# Patient Record
Sex: Female | Born: 1960 | Race: White | Hispanic: No | Marital: Married | Smoking: Former smoker
Health system: Southern US, Community
[De-identification: ages and names within clinical notes are randomized; demographics above are authoritative.]

## PROBLEM LIST (undated history)

## (undated) DIAGNOSIS — K589 Irritable bowel syndrome without diarrhea: Secondary | ICD-10-CM

## (undated) DIAGNOSIS — R51 Headache: Secondary | ICD-10-CM

## (undated) DIAGNOSIS — S0300XA Dislocation of jaw, unspecified side, initial encounter: Secondary | ICD-10-CM

## (undated) DIAGNOSIS — M1990 Unspecified osteoarthritis, unspecified site: Secondary | ICD-10-CM

## (undated) DIAGNOSIS — D649 Anemia, unspecified: Secondary | ICD-10-CM

## (undated) DIAGNOSIS — Z87442 Personal history of urinary calculi: Secondary | ICD-10-CM

## (undated) DIAGNOSIS — F419 Anxiety disorder, unspecified: Secondary | ICD-10-CM

## (undated) DIAGNOSIS — E785 Hyperlipidemia, unspecified: Secondary | ICD-10-CM

## (undated) DIAGNOSIS — Z8669 Personal history of other diseases of the nervous system and sense organs: Secondary | ICD-10-CM

## (undated) HISTORY — DX: Hyperlipidemia, unspecified: E78.5

## (undated) HISTORY — DX: Irritable bowel syndrome, unspecified: K58.9

## (undated) HISTORY — DX: Personal history of other diseases of the nervous system and sense organs: Z86.69

## (undated) HISTORY — DX: Anxiety disorder, unspecified: F41.9

## (undated) HISTORY — DX: Anemia, unspecified: D64.9

## (undated) HISTORY — PX: LASIK: SHX215

---

## 1982-08-09 HISTORY — PX: NASAL SEPTUM SURGERY: SHX37

## 1991-08-10 DIAGNOSIS — Z87442 Personal history of urinary calculi: Secondary | ICD-10-CM

## 1991-08-10 HISTORY — DX: Personal history of urinary calculi: Z87.442

## 1999-05-22 ENCOUNTER — Encounter: Admission: RE | Admit: 1999-05-22 | Discharge: 1999-05-22 | Payer: Self-pay | Admitting: Obstetrics and Gynecology

## 1999-05-29 ENCOUNTER — Encounter: Payer: Self-pay | Admitting: Obstetrics and Gynecology

## 1999-05-29 ENCOUNTER — Encounter: Admission: RE | Admit: 1999-05-29 | Discharge: 1999-05-29 | Payer: Self-pay | Admitting: Obstetrics and Gynecology

## 1999-05-30 ENCOUNTER — Ambulatory Visit (HOSPITAL_COMMUNITY): Admission: RE | Admit: 1999-05-30 | Discharge: 1999-05-30 | Payer: Self-pay | Admitting: Obstetrics and Gynecology

## 2000-03-15 ENCOUNTER — Encounter: Admission: RE | Admit: 2000-03-15 | Discharge: 2000-03-15 | Payer: Self-pay | Admitting: Obstetrics and Gynecology

## 2000-03-15 ENCOUNTER — Encounter: Payer: Self-pay | Admitting: Obstetrics and Gynecology

## 2000-08-09 HISTORY — PX: VAGINAL HYSTERECTOMY: SUR661

## 2001-02-08 ENCOUNTER — Other Ambulatory Visit: Admission: RE | Admit: 2001-02-08 | Discharge: 2001-02-08 | Payer: Self-pay | Admitting: Obstetrics and Gynecology

## 2001-02-23 ENCOUNTER — Ambulatory Visit (HOSPITAL_COMMUNITY): Admission: RE | Admit: 2001-02-23 | Discharge: 2001-02-23 | Payer: Self-pay | Admitting: Obstetrics and Gynecology

## 2001-05-30 ENCOUNTER — Encounter: Payer: Self-pay | Admitting: Obstetrics and Gynecology

## 2001-05-30 ENCOUNTER — Encounter: Admission: RE | Admit: 2001-05-30 | Discharge: 2001-05-30 | Payer: Self-pay | Admitting: Obstetrics and Gynecology

## 2001-07-26 ENCOUNTER — Observation Stay (HOSPITAL_COMMUNITY): Admission: RE | Admit: 2001-07-26 | Discharge: 2001-07-27 | Payer: Self-pay | Admitting: Obstetrics and Gynecology

## 2002-05-31 ENCOUNTER — Encounter: Admission: RE | Admit: 2002-05-31 | Discharge: 2002-05-31 | Payer: Self-pay | Admitting: Obstetrics and Gynecology

## 2002-05-31 ENCOUNTER — Encounter: Payer: Self-pay | Admitting: Obstetrics and Gynecology

## 2003-06-17 ENCOUNTER — Encounter: Admission: RE | Admit: 2003-06-17 | Discharge: 2003-06-17 | Payer: Self-pay | Admitting: Obstetrics and Gynecology

## 2004-07-09 ENCOUNTER — Ambulatory Visit (HOSPITAL_COMMUNITY): Admission: RE | Admit: 2004-07-09 | Discharge: 2004-07-09 | Payer: Self-pay | Admitting: Obstetrics and Gynecology

## 2004-07-16 ENCOUNTER — Encounter: Admission: RE | Admit: 2004-07-16 | Discharge: 2004-07-16 | Payer: Self-pay | Admitting: Obstetrics and Gynecology

## 2005-09-23 ENCOUNTER — Ambulatory Visit (HOSPITAL_COMMUNITY): Admission: RE | Admit: 2005-09-23 | Discharge: 2005-09-23 | Payer: Self-pay | Admitting: Obstetrics and Gynecology

## 2012-10-24 ENCOUNTER — Telehealth: Payer: Self-pay | Admitting: General Practice

## 2012-10-24 NOTE — Telephone Encounter (Signed)
RETURNING CALL TO TRIAGE NURSE ABOUT AN  APPT

## 2012-10-24 NOTE — Telephone Encounter (Signed)
PT CALLED ABOUT APPT 

## 2012-11-09 ENCOUNTER — Other Ambulatory Visit: Payer: Self-pay | Admitting: Physician Assistant

## 2012-11-09 ENCOUNTER — Other Ambulatory Visit: Payer: Self-pay | Admitting: *Deleted

## 2012-11-09 DIAGNOSIS — F411 Generalized anxiety disorder: Secondary | ICD-10-CM

## 2012-11-09 MED ORDER — DIAZEPAM 5 MG PO TABS: 5.0000 mg | ORAL_TABLET | Freq: Two times a day (BID) | ORAL | Status: DC | PRN

## 2012-11-09 MED ORDER — DIAZEPAM 5 MG PO TABS: 5.0000 mg | ORAL_TABLET | Freq: Three times a day (TID) | ORAL | Status: DC | PRN

## 2012-11-09 NOTE — Telephone Encounter (Signed)
Please forward request to [facility] as she will be seeing her in the future

## 2012-11-09 NOTE — Telephone Encounter (Signed)
Last seen by [facility] on12/04/13

## 2012-11-09 NOTE — Telephone Encounter (Signed)
Authorized and printed at station A

## 2012-11-10 ENCOUNTER — Other Ambulatory Visit: Payer: Self-pay | Admitting: *Deleted

## 2012-11-10 NOTE — Telephone Encounter (Signed)
Left pt message, rx for Valium is ready for pt to come by office for.

## 2012-11-10 NOTE — Telephone Encounter (Signed)
Left message to pickup prescription at front desk.  

## 2012-11-16 ENCOUNTER — Encounter: Payer: Self-pay | Admitting: Family Medicine

## 2012-11-16 ENCOUNTER — Ambulatory Visit (INDEPENDENT_AMBULATORY_CARE_PROVIDER_SITE_OTHER): Payer: 59

## 2012-11-16 ENCOUNTER — Ambulatory Visit (INDEPENDENT_AMBULATORY_CARE_PROVIDER_SITE_OTHER): Payer: 59 | Admitting: Family Medicine

## 2012-11-16 VITALS — BP 111/73 | HR 83 | Temp 96.7°F | Ht 66.75 in | Wt 137.6 lb

## 2012-11-16 DIAGNOSIS — Z130 Encounter for screening for diseases of the blood and blood-forming organs and certain disorders involving the immune mechanism: Secondary | ICD-10-CM

## 2012-11-16 DIAGNOSIS — Z0000 Encounter for general adult medical examination without abnormal findings: Secondary | ICD-10-CM

## 2012-11-16 DIAGNOSIS — H6120 Impacted cerumen, unspecified ear: Secondary | ICD-10-CM

## 2012-11-16 DIAGNOSIS — Z139 Encounter for screening, unspecified: Secondary | ICD-10-CM

## 2012-11-16 DIAGNOSIS — M25512 Pain in left shoulder: Secondary | ICD-10-CM

## 2012-11-16 DIAGNOSIS — H6123 Impacted cerumen, bilateral: Secondary | ICD-10-CM

## 2012-11-16 DIAGNOSIS — M545 Low back pain, unspecified: Secondary | ICD-10-CM

## 2012-11-16 DIAGNOSIS — M25519 Pain in unspecified shoulder: Secondary | ICD-10-CM

## 2012-11-16 DIAGNOSIS — Z13220 Encounter for screening for lipoid disorders: Secondary | ICD-10-CM

## 2012-11-16 DIAGNOSIS — Z1321 Encounter for screening for nutritional disorder: Secondary | ICD-10-CM

## 2012-11-16 DIAGNOSIS — R42 Dizziness and giddiness: Secondary | ICD-10-CM

## 2012-11-16 LAB — POCT CBC
HCT, POC: 42.3 % (ref 37.7–47.9)
Lymph, poc: 3.8 — AB (ref 0.6–3.4)
MCH, POC: 31.1 pg (ref 27–31.2)
MCHC: 32.7 g/dL (ref 31.8–35.4)
MPV: 8.1 fL (ref 0–99.8)
Platelet Count, POC: 367 10*3/uL (ref 142–424)
RBC: 4.4 M/uL (ref 4.04–5.48)
RDW, POC: 15.5 %
WBC: 10.7 10*3/uL — AB (ref 4.6–10.2)

## 2012-11-16 LAB — BASIC METABOLIC PANEL WITH GFR
CO2: 25 mEq/L (ref 19–32)
Sodium: 140 mEq/L (ref 135–145)

## 2012-11-16 LAB — HEPATIC FUNCTION PANEL
Indirect Bilirubin: 0.1 mg/dL (ref 0.0–0.9)
Total Bilirubin: 0.2 mg/dL — ABNORMAL LOW (ref 0.3–1.2)

## 2012-11-16 LAB — THYROID PANEL WITH TSH
T3 Uptake: 31.3 % (ref 22.5–37.0)
T4, Total: 6.7 ug/dL (ref 5.0–12.5)

## 2012-11-16 NOTE — Progress Notes (Signed)
  Subjective:    Patient ID: Theresa Pham, female    DOB: March 08, 1961, 52 y.o.   MRN: [id number]  HPI Theresa Pham comes in today for a physical exam She also complains that allergies are bothering her a lot at this point in time. She is taking Sudafed 30 mg and Benadryl 25 mg.   Review of Systems  Constitutional: Negative.   HENT: Negative.   Eyes: Positive for itching (due to allergies).  Respiratory: Negative.   Cardiovascular: Negative.   Gastrointestinal: Negative.   Genitourinary: Negative.   Musculoskeletal: Negative.   Allergic/Immunologic: Positive for environmental allergies.  Neurological: Positive for dizziness (inner ear).  Psychiatric/Behavioral: Positive for sleep disturbance (X 1 week). The patient is nervous/anxious (takes meds).        Objective:   Physical Exam  BP 111/73  Pulse 83  Temp(Src) 96.7 F (35.9 C) (Oral)  Ht 5' 6.75" (1.695 m)  Wt 137 lb 9.6 oz (62.415 kg)  BMI 21.72 kg/m2  The patient appeared well nourished and normally developed, alert and oriented to time and place. Speech, behavior and judgement appear normal. Vital signs as documented.  Head exam is unremarkable. No scleral icterus or pallor noted. There is earwax bilaterally. There is drainage down the back of the throat. Neck is without jugular venous distension, thyromegally, or carotid bruits. Carotid upstrokes are brisk bilaterally. No cervical adenopathy. Lungs are clear anteriorly and posteriorly to auscultation. Normal respiratory effort. Cardiac exam reveals regular rate and rhythm@ 72/min. First and second heart sounds normal. No murmurs, rubs or gallops.  Abdominal exam reveals normal bowl sounds, no masses, no organomegaly and no aortic enlargement. No inguinal adenopathy. Light epigastric tenderness. Extremities are nonedematous and both femoral and pedal pulses are normal. Skin without pallor or jaundice.  Warm and dry, without rash. Neurologic exam reveals normal but diminished  deep tendon reflexes and normal sensation.          Assessment & Plan:  1. Dizziness and giddiness - POCT CBC - Thyroid Panel With TSH - BASIC METABOLIC PANEL WITH GFR - EKG [id number]; Standing - EKG 12-Lead  2. Encounter for vitamin deficiency screening - Vitamin D 25 hydroxy  3. Screening cholesterol level - NMR Lipoprofile with Lipids - Hepatic function panel  4. Pain in joint, shoulder region, left - Ambulatory referral to Chiropractic  5. LBP (low back pain) - Ambulatory referral to Chiropractic  6. Screening - MM Digital Screening; Future  7. Routine general medical examination at a health care facility - DG Chest 2 View; Future - EKG 12-Lead; Standing - EKG 12-Lead  8. Excessive cerumen in ear canal, bilateral - Ear wax removal

## 2012-11-16 NOTE — Patient Instructions (Addendum)
Schedule mammogram, pap and pelvic exam Continue current meds and therapeutic lifestyle changes Try Nasacort AQ over-the-counter. 2 sprays each nostril once a day for 2 weeks, then 1 spray each nostril  Mucinex maximum strength 1 twice daily as needed for cough and congestion Routine labs were drawn today and  will be called about the results of these

## 2012-11-17 LAB — VITAMIN D 25 HYDROXY (VIT D DEFICIENCY, FRACTURES): Vit D, 25-Hydroxy: 35 ng/mL (ref 30–89)

## 2012-11-17 LAB — NMR LIPOPROFILE WITH LIPIDS
Cholesterol, Total: 222 mg/dL — ABNORMAL HIGH (ref <200)
HDL Size: 8.8 nm — ABNORMAL LOW (ref >=9.2)
LDL Size: 21 nm (ref >20.5)
LP-IR Score: 35 (ref <=45)
Small LDL Particle Number: 624 nmol/L — ABNORMAL HIGH (ref <=527)

## 2012-11-20 ENCOUNTER — Telehealth: Payer: Self-pay | Admitting: Family Medicine

## 2012-11-20 NOTE — Progress Notes (Signed)
Pt notified of result

## 2012-11-20 NOTE — Telephone Encounter (Signed)
Pt notified of results

## 2012-11-22 ENCOUNTER — Ambulatory Visit (HOSPITAL_COMMUNITY): Payer: Self-pay

## 2012-12-07 ENCOUNTER — Encounter: Payer: Self-pay | Admitting: Pharmacist

## 2012-12-07 ENCOUNTER — Ambulatory Visit (INDEPENDENT_AMBULATORY_CARE_PROVIDER_SITE_OTHER): Payer: 59 | Admitting: Pharmacist

## 2012-12-07 VITALS — BP 124/80 | HR 76 | Ht 67.5 in | Wt 140.0 lb

## 2012-12-07 DIAGNOSIS — G43909 Migraine, unspecified, not intractable, without status migrainosus: Secondary | ICD-10-CM

## 2012-12-07 DIAGNOSIS — E785 Hyperlipidemia, unspecified: Secondary | ICD-10-CM

## 2012-12-07 DIAGNOSIS — F411 Generalized anxiety disorder: Secondary | ICD-10-CM

## 2012-12-07 NOTE — Progress Notes (Signed)
Lipid Clinic Consultation  Chief Complaint:   Chief Complaint  Patient presents with  . Hyperlipidemia    Filed Vitals:   12/07/12 1612  BP: 124/80  Pulse: 76    Exam Edema:  negative Respirations:  normal   Carotid Bruits:  negative Xanthomas:  negative General Appearance:  well nourished Mood/Affect:  normal  HPI:  Patient has fasting labs 11/16/2012 that showed elevated Tg and LDL-P.  FH - positive for CAD - father had MI at 53yo         Negative for DM          Positive cancer - mother had uterine CA at 64yo, maternal aunt breast CA        Component Value Date/Time   TRIG 154* 11/16/2012 1019   LDL-P = 1899 LDL-C = 156  Assessment: CHD/CHF Risk Equivalents:  none AHA ASCVD risk assessment:    30yr risk = 2.8%  NCEP Risk Factors Present:  family history Primary Problem(s):  LDL or LDL-P elevated and TG elevated  Lipid Goals: LDL Goal < 130 HDL Goal >/= 45 Tg Goal < 161 Non-HDL Goal < 160  Secondary cause of hyperlipidemia present:  none Low fat diet followed?  No - eats little Debbie oatmeal cookie for breakfast.  Lunch- usually skips or has grilled chicken or nabs.  Supper - meat and 2 vegetables Low carb diet followed?  No -   Exercise?  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  Increase whole grains - oatmeal, whole grain rice, quinoa  Limit meal serving sizes to palm size and avoid processed meats like sausage, bologna and hot dogs Increase aerobic exercise - 30 to 40 minutes daily 4 days per week  Recheck Lipid Panel:  3 months   Time spent counseling patient:  30 minutes   Referring Provider:  Rudi Heap

## 2012-12-20 ENCOUNTER — Other Ambulatory Visit: Payer: Self-pay

## 2012-12-20 MED ORDER — DIAZEPAM 5 MG PO TABS: 5.0000 mg | ORAL_TABLET | Freq: Three times a day (TID) | ORAL | Status: DC | PRN

## 2012-12-20 NOTE — Telephone Encounter (Signed)
Please phone in rx for diazepam

## 2012-12-20 NOTE — Telephone Encounter (Signed)
Last filled 11/13/12  Last seen 12/07/12   Phone in RX and have nurse call patient

## 2012-12-21 NOTE — Telephone Encounter (Signed)
Pt and pharm aware of rx diazepam

## 2013-01-23 ENCOUNTER — Other Ambulatory Visit: Payer: Self-pay | Admitting: Nurse Practitioner

## 2013-01-23 ENCOUNTER — Other Ambulatory Visit: Payer: 59 | Admitting: Nurse Practitioner

## 2013-01-23 MED ORDER — DIAZEPAM 5 MG PO TABS: 5.0000 mg | ORAL_TABLET | Freq: Three times a day (TID) | ORAL | Status: DC | PRN

## 2013-01-23 NOTE — Telephone Encounter (Signed)
Rx called to [facility]

## 2013-01-23 NOTE — Telephone Encounter (Signed)
Please call in rx for valium with 1 refill

## 2013-01-24 NOTE — Telephone Encounter (Signed)
Med called to pharm 

## 2013-04-04 ENCOUNTER — Other Ambulatory Visit: Payer: Self-pay

## 2013-04-04 MED ORDER — DIAZEPAM 5 MG PO TABS: 5.0000 mg | ORAL_TABLET | Freq: Three times a day (TID) | ORAL | Status: DC | PRN

## 2013-04-04 NOTE — Telephone Encounter (Signed)
I did not prescribe this medication to this patient originally. She should try to reduce the frequency of taking this to twice daily. We may refill it and give her #60 one twice daily as needed with one refill. She should work on reducing this further to 1 daily on future refills.

## 2013-04-04 NOTE — Telephone Encounter (Signed)
Last seen 11/16/12 DWM  Last filled 01/23/13  If approved route to nurse to call in

## 2013-04-06 ENCOUNTER — Ambulatory Visit: Payer: 59 | Admitting: Family Medicine

## 2013-04-12 ENCOUNTER — Encounter: Payer: Self-pay | Admitting: General Practice

## 2013-04-12 ENCOUNTER — Ambulatory Visit (INDEPENDENT_AMBULATORY_CARE_PROVIDER_SITE_OTHER): Payer: 59 | Admitting: General Practice

## 2013-04-12 VITALS — BP 115/78 | HR 82 | Temp 97.1°F | Ht 67.0 in | Wt 132.0 lb

## 2013-04-12 DIAGNOSIS — R112 Nausea with vomiting, unspecified: Secondary | ICD-10-CM

## 2013-04-12 DIAGNOSIS — E785 Hyperlipidemia, unspecified: Secondary | ICD-10-CM

## 2013-04-12 LAB — POCT CBC
Granulocyte percent: 57.2 %G (ref 37–80)
Lymph, poc: 3.6 — AB (ref 0.6–3.4)
MCHC: 32.8 g/dL (ref 31.8–35.4)
MPV: 7.6 fL (ref 0–99.8)
POC Granulocyte: 5.5 (ref 2–6.9)
POC LYMPH PERCENT: 37.3 %L (ref 10–50)
Platelet Count, POC: 323 10*3/uL (ref 142–424)
RBC: 4.2 M/uL (ref 4.04–5.48)
RDW, POC: 15 %
WBC: 9.6 10*3/uL (ref 4.6–10.2)

## 2013-04-12 NOTE — Progress Notes (Signed)
  Subjective:    Patient ID: Theresa Pham, female    DOB: 08/18/1960, 52 y.o.   MRN: [id number]  Emesis  This is a new problem. The current episode started more than 1 month ago (6-8 months ago). The problem occurs intermittently. The problem has been gradually worsening. The emesis has an appearance of bile. There has been no fever. Associated symptoms include myalgias. Pertinent negatives include no abdominal pain, chest pain, chills, coughing, diarrhea, dizziness, fever or headaches. She has tried nothing for the symptoms.  Reports having bowel movements three to four times a week.     Review of Systems  Constitutional: Negative for fever and chills.  HENT: Negative for neck pain and neck stiffness.   Respiratory: Negative for cough and chest tightness.   Cardiovascular: Negative for chest pain.  Gastrointestinal: Positive for vomiting. Negative for nausea, abdominal pain, diarrhea, blood in stool and abdominal distention.  Genitourinary: Negative for hematuria and difficulty urinating.  Musculoskeletal: Positive for myalgias.  Neurological: Negative for dizziness, weakness and headaches.       Objective:   Physical Exam  Constitutional: She is oriented to person, place, and time. She appears well-developed and well-nourished.  HENT:  Head: Normocephalic and atraumatic.  Right Ear: External ear normal.  Left Ear: External ear normal.  Nose: Nose normal.  Mouth/Throat: Oropharynx is clear and moist.  Eyes: EOM are normal. Pupils are equal, round, and reactive to light.  Neck: Normal range of motion. Neck supple. No thyromegaly present.  Cardiovascular: Normal rate, regular rhythm and normal heart sounds.   Pulmonary/Chest: Effort normal and breath sounds normal. No respiratory distress. She exhibits no tenderness.  Abdominal: Soft. Bowel sounds are normal. She exhibits no distension. There is no tenderness.  Lymphadenopathy:    She has no cervical adenopathy.  Neurological:  She is alert and oriented to person, place, and time.  Skin: Skin is warm and dry.  Psychiatric: She has a normal mood and affect.          Assessment & Plan:  1. Other and unspecified hyperlipidemia - NMR, lipoprofile  2. Nausea with vomiting - POCT CBC - CMP14+EGFR - Ambulatory referral to Gastroenterology -RTO or seek emergency medical treatment if symptoms worsen or unresolved -Patient verbalized understanding -Coralie Keens, FNP-C

## 2013-04-12 NOTE — Patient Instructions (Signed)

## 2013-04-14 LAB — CMP14+EGFR
ALT: 9 IU/L (ref 0–32)
Albumin/Globulin Ratio: 1.9 (ref 1.1–2.5)
Albumin: 4.6 g/dL (ref 3.5–5.5)
Calcium: 10.1 mg/dL (ref 8.7–10.2)
GFR calc Af Amer: 115 mL/min/{1.73_m2} (ref >59)
Globulin, Total: 2.4 g/dL (ref 1.5–4.5)
Potassium: 4.1 mmol/L (ref 3.5–5.2)
Total Protein: 7 g/dL (ref 6.0–8.5)

## 2013-04-14 LAB — NMR, LIPOPROFILE
Cholesterol: 226 mg/dL — ABNORMAL HIGH (ref <200)
HDL Particle Number: 25.8 umol/L — ABNORMAL LOW (ref >=30.5)
LDL Size: 21.1 nm (ref >20.5)
LDLC SERPL CALC-MCNC: 158 mg/dL — ABNORMAL HIGH (ref <100)
Small LDL Particle Number: 613 nmol/L — ABNORMAL HIGH (ref <=527)

## 2013-04-18 ENCOUNTER — Telehealth: Payer: Self-pay | Admitting: General Practice

## 2013-04-30 ENCOUNTER — Encounter: Payer: Self-pay | Admitting: Internal Medicine

## 2013-05-02 ENCOUNTER — Telehealth: Payer: Self-pay | Admitting: *Deleted

## 2013-05-02 NOTE — Telephone Encounter (Signed)
Pt uses [facility] in [place].  Please send in refills on Effexor and Imitrex if you havn't already.

## 2013-05-04 ENCOUNTER — Other Ambulatory Visit: Payer: Self-pay | Admitting: General Practice

## 2013-05-04 DIAGNOSIS — F411 Generalized anxiety disorder: Secondary | ICD-10-CM

## 2013-05-04 DIAGNOSIS — G43909 Migraine, unspecified, not intractable, without status migrainosus: Secondary | ICD-10-CM

## 2013-05-04 MED ORDER — VENLAFAXINE HCL ER 75 MG PO CP24: 75.0000 mg | ORAL_CAPSULE | Freq: Every day | ORAL | Status: DC

## 2013-05-04 MED ORDER — SUMATRIPTAN SUCCINATE 100 MG PO TABS: ORAL_TABLET | ORAL | Status: DC

## 2013-05-04 NOTE — Telephone Encounter (Signed)
Script sent to pharmacy.

## 2013-05-14 ENCOUNTER — Other Ambulatory Visit: Payer: Self-pay

## 2013-05-14 NOTE — Telephone Encounter (Signed)
Last seen 04/12/13  Mae   If approved route and phone into [facility] in [place]

## 2013-05-16 ENCOUNTER — Other Ambulatory Visit: Payer: Self-pay | Admitting: Family Medicine

## 2013-05-16 NOTE — Telephone Encounter (Signed)
If approved route to nurse pool A and then approve.

## 2013-05-17 ENCOUNTER — Telehealth: Payer: Self-pay | Admitting: General Practice

## 2013-05-17 ENCOUNTER — Other Ambulatory Visit: Payer: Self-pay | Admitting: Family Medicine

## 2013-05-18 MED ORDER — DIAZEPAM 5 MG PO TABS: 5.0000 mg | ORAL_TABLET | Freq: Three times a day (TID) | ORAL | Status: DC | PRN

## 2013-05-18 NOTE — Telephone Encounter (Signed)
Spoke with Judie Grieve at [facility] in [place] and authorized one month. Patient will need to be seen for additional refills.

## 2013-05-18 NOTE — Telephone Encounter (Signed)
DEINED, PT AWARE, WILL MAKE APPT

## 2013-05-22 ENCOUNTER — Encounter: Payer: Self-pay | Admitting: General Practice

## 2013-05-22 ENCOUNTER — Encounter (INDEPENDENT_AMBULATORY_CARE_PROVIDER_SITE_OTHER): Payer: 59 | Admitting: General Practice

## 2013-05-22 DIAGNOSIS — F411 Generalized anxiety disorder: Secondary | ICD-10-CM

## 2013-05-22 NOTE — Progress Notes (Signed)
Patient ID: Theresa Pham, female   DOB: 08/20/1960, 52 y.o.   MRN: [id number]  Patient only her to pick up prescription.

## 2013-05-28 ENCOUNTER — Encounter: Payer: Self-pay | Admitting: Internal Medicine

## 2013-05-29 ENCOUNTER — Ambulatory Visit (INDEPENDENT_AMBULATORY_CARE_PROVIDER_SITE_OTHER): Payer: 59 | Admitting: Internal Medicine

## 2013-05-29 ENCOUNTER — Encounter: Payer: Self-pay | Admitting: Internal Medicine

## 2013-05-29 ENCOUNTER — Other Ambulatory Visit (INDEPENDENT_AMBULATORY_CARE_PROVIDER_SITE_OTHER): Payer: 59

## 2013-05-29 VITALS — BP 120/78 | HR 66 | Ht 67.0 in | Wt 134.4 lb

## 2013-05-29 DIAGNOSIS — Z1211 Encounter for screening for malignant neoplasm of colon: Secondary | ICD-10-CM

## 2013-05-29 DIAGNOSIS — R109 Unspecified abdominal pain: Secondary | ICD-10-CM

## 2013-05-29 DIAGNOSIS — R112 Nausea with vomiting, unspecified: Secondary | ICD-10-CM

## 2013-05-29 MED ORDER — MOVIPREP 100 G PO SOLR: ORAL | Status: DC

## 2013-05-29 MED ORDER — ONDANSETRON 4 MG PO TBDP: 4.0000 mg | ORAL_TABLET | Freq: Three times a day (TID) | ORAL | Status: DC | PRN

## 2013-05-29 NOTE — Patient Instructions (Signed)
You have been scheduled for a colonoscopyEndoscopy with propofol. Please follow written instructions given to you at your visit today.  Please pick up your prep kit at the pharmacy within the next 1-3 days. If you use inhalers (even only as needed), please bring them with you on the day of your procedure. Your physician has requested that you go to www.startemmi.com and enter the access code given to you at your visit today. This web site gives a general overview about your procedure. However, you should still follow specific instructions given to you by our office regarding your preparation for the procedure.  You have been scheduled for an abdominal ultrasound at [facility] (1st floor of hospital) on 05/30/2013 at 9:00. Please arrive 15 minutes prior to your appointment for registration. Make certain not to have anything to eat or drink 6 hours prior to your appointment. Should you need to reschedule your appointment, please contact radiology at [phone]. This test typically takes about 30 minutes to perform.  Your physician has requested that you go to the basement for the following lab work before leaving today: Celiac Panel  We have sent the following medications to your pharmacy for you to pick up at your convenience: Moviprep, Zofran                                               We are excited to introduce MyChart, a new best-in-class service that provides you online access to important information in your electronic medical record. We want to make it easier for you to view your health information - all in one secure location - when and where you need it. We expect MyChart will enhance the quality of care and service we provide.  When you register for MyChart, you can:    View your test results.    Request appointments and receive appointment reminders via email.    Request medication renewals.    View your medical history, allergies, medications and immunizations.    Communicate with your physician's office through a password-protected site.    Conveniently print information such as your medication lists.  To find out if MyChart is right for you, please talk to a member of our clinical staff today. We will gladly answer your questions about this free health and wellness tool.  If you are age 53 or older and want a member of your family to have access to your record, you must provide written consent by completing a proxy form available at our office. Please speak to our clinical staff about guidelines regarding accounts for patients younger than age 20.  As you activate your MyChart account and need any technical assistance, please call the MyChart technical support line at [phone]) or email your question to [email or web address]. If you email your question(s), please include your name, a return phone number and the best time to reach you.  If you have non-urgent health-related questions, you can send a message to our office through MyChart at [email or web address]. If you have a medical emergency, call 911.  Thank you for using MyChart as your new health and wellness resource!   MyChart licensed from [employer],  [id number]. Patents Pending.

## 2013-05-29 NOTE — Progress Notes (Signed)
Patient ID: Theresa Pham, female   DOB: Dec 20, 1960, 52 y.o.   MRN: [id number] HPI: Theresa Pham is a 52 year old female with a past medical history of anxiety, depression, hyperlipidemia, migraines, and kidney stones who is seen in consultation at the request of Dr. Christell Constant to evaluate episodic abdominal pain with nausea and vomiting. She is here alone today. The patient reports over the last approximate year she has developed episodic abdominal pain associated with nausea and vomiting. She reports this happens approximately every 4-8 weeks over the last several months. Her last episode was in late September. She reports waking, usually early around 5 AM, with nausea and vomiting and "cold sweats". This is often associated with epigastric abdominal pain and extreme fatigue. Her symptoms last most of the day and are usually significantly improved by the next day. She does not have associated diarrhea or change in bowel habit. She reports normal bowel movements once daily to once every other day. No diarrhea or constipation. She denies blood in her stool or melena. Her weight has been stable. He does use Goody's powder at least daily for aches and pains and headaches. Occasionally she will use this 2-3 times daily. She does use Phenergan as needed for nausea which seems to help and she is able to keep it down. No other new medicines. She denies excessive alcohol intake. No known family history of gallstones, celiac disease or IBD. She's never had endoscopy or colonoscopy.  Past Medical History  Diagnosis Date  . Depression   . Anxiety   . Hx of migraine headaches   . Hyperlipidemia   . IBS (irritable bowel syndrome)   . Kidney stone 1993    x 1    Past Surgical History  Procedure Laterality Date  . Vaginal hysterectomy      has her right ovary  . Lasik  2006, 2012    Current Outpatient Prescriptions  Medication Sig Dispense Refill  . Aspirin-Acetaminophen-Caffeine (GOODY HEADACHE PO) Take by  mouth as needed.      . cholecalciferol (VITAMIN D) 1000 UNITS tablet Take 1,000 Units by mouth daily.      . diazepam (VALIUM) 5 MG tablet Take 1 tablet (5 mg total) by mouth every 8 (eight) hours as needed for anxiety.  90 tablet  0  . diphenhydrAMINE (BENADRYL) 25 MG tablet Take 25 mg by mouth every 6 (six) hours as needed for allergies or sleep.      . Ginger, Zingiber officinalis, (GINGER ROOT PO) Take 1 capsule by mouth. As needed for stomach upset      . KRILL OIL PO Take by mouth.      . meclizine (ANTIVERT) 25 MG tablet Take 25 mg by mouth 3 (three) times daily as needed for dizziness.      . milk thistle 175 MG tablet Take 175 mg by mouth daily.      . pseudoephedrine (SUDAFED) 30 MG tablet Take 60 mg by mouth every 4 (four) hours as needed for congestion.      . SUMAtriptan (IMITREX) 100 MG tablet May take 100mg  once, then repeat after two hours if needed. Not more than 200mg  in 24 hours.  10 tablet  0  . venlafaxine XR (EFFEXOR-XR) 75 MG 24 hr capsule Take 1 capsule (75 mg total) by mouth daily.  30 capsule  3  . MOVIPREP 100 G SOLR Use per prep instruction  1 kit  0  . ondansetron (ZOFRAN ODT) 4 MG disintegrating tablet Take 1 tablet (  4 mg total) by mouth every 8 (eight) hours as needed for nausea.  20 tablet  0   No current facility-administered medications for this visit.    Allergies  Allergen Reactions  . Azithromycin Diarrhea  . Penicillins Rash  . Codeine Nausea Only    Family History  Problem Relation Age of Onset  . Hypertension Mother   . Uterine cancer Mother   . Heart attack Father 74  . Colon cancer Neg Hx   . Breast cancer Maternal Aunt     History  Substance Use Topics  . Smoking status: Former Smoker    Types: Cigarettes  . Smokeless tobacco: Never Used  . Alcohol Use: Yes     Comment: rare, social    ROS: As per history of present illness, otherwise negative  BP 120/78  Pulse 66  Ht 5\' 7"  (1.702 m)  Wt 134 lb 6.4 oz (60.963 kg)  BMI 21.04  kg/m2 Constitutional: Well-developed and well-nourished. No distress. HEENT: Normocephalic and atraumatic. Oropharynx is clear and moist. No oropharyngeal exudate. Conjunctivae are normal.  No scleral icterus. Neck: Neck supple. Trachea midline. Cardiovascular: Normal rate, regular rhythm and intact distal pulses. Pulmonary/chest: Effort normal and breath sounds normal. No wheezing, rales or rhonchi. Abdominal: Soft, nontender, nondistended. Bowel sounds active throughout.  Extremities: no clubbing, cyanosis, or edema Lymphadenopathy: No cervical adenopathy noted. Neurological: Alert and oriented to person place and time. Skin: Skin is warm and dry. No rashes noted. Psychiatric: Normal mood and affect. Behavior is normal.  RELEVANT LABS AND IMAGING: CBC    Component Value Date/Time   WBC 9.6 04/12/2013 1052   RBC 4.2 04/12/2013 1052   HGB 13.0 04/12/2013 1052   HCT 39.6 04/12/2013 1052   MCV 95.2 04/12/2013 1052   MCH 31.2 04/12/2013 1052   MCHC 32.8 04/12/2013 1052    CMP     Component Value Date/Time   NA 143 04/12/2013 1023   NA 140 11/16/2012 1019   K 4.1 04/12/2013 1023   CL 101 04/12/2013 1023   CO2 25 04/12/2013 1023   GLUCOSE 82 04/12/2013 1023   GLUCOSE 60* 11/16/2012 1019   BUN 7 04/12/2013 1023   BUN 11 11/16/2012 1019   CREATININE 0.68 04/12/2013 1023   CREATININE 0.84 11/16/2012 1019   CALCIUM 10.1 04/12/2013 1023   PROT 7.0 04/12/2013 1023   PROT 7.2 11/16/2012 1019   ALBUMIN 4.6 11/16/2012 1019   AST 17 04/12/2013 1023   ALT 9 04/12/2013 1023   ALKPHOS 90 04/12/2013 1023   BILITOT 0.3 04/12/2013 1023   GFRNONAA 100 04/12/2013 1023   GFRAA 115 04/12/2013 1023    ASSESSMENT/PLAN: 52 year old female with a past medical history of anxiety, depression, hyperlipidemia, migraines, and kidney stones who is seen in consultation at the request of Dr. Christell Constant to evaluate episodic abdominal pain with nausea and vomiting.   1.  Episodic epigastric pain with N/V -- her symptoms raise the question of peptic ulcer  disease/gastroduodenitis or possibly gallstones with attacks of biliary colic. She also is at risk for gastritis and ulcer disease due to daily use of Goody's powders. We discussed workup and I have recommended an upper endoscopy to evaluate for gastritis and ulcer disease. We will also perform an abdominal ultrasound to rule out gallstones. I will prescribe Zofran ODT which she can use as needed and as directed for nausea. Given that this will dissolve in her mouth, it may be easier for her to take then oral Phenergan when she  is nauseous. I also will check a celiac panel.  2.  Colon cancer screening -- I recommended a screening colonoscopy based on her age. She has never had screening colonoscopy. We discussed the test today including the risks and benefits of both the upper endoscopy and colonoscopy, and she is agreeable to proceed

## 2013-05-30 ENCOUNTER — Ambulatory Visit (HOSPITAL_COMMUNITY)
Admission: RE | Admit: 2013-05-30 | Discharge: 2013-05-30 | Disposition: A | Discharge status: Home / Self Care | Payer: 59 | Source: Ambulatory Visit | Attending: Internal Medicine | Admitting: Internal Medicine

## 2013-05-30 DIAGNOSIS — K8020 Calculus of gallbladder without cholecystitis without obstruction: Secondary | ICD-10-CM | POA: Insufficient documentation

## 2013-05-30 DIAGNOSIS — R112 Nausea with vomiting, unspecified: Secondary | ICD-10-CM

## 2013-05-30 DIAGNOSIS — R109 Unspecified abdominal pain: Secondary | ICD-10-CM

## 2013-05-30 LAB — TISSUE TRANSGLUTAMINASE, IGA: Tissue Transglutaminase Ab, IgA: 15.8 U/mL (ref <20)

## 2013-06-04 ENCOUNTER — Ambulatory Visit (AMBULATORY_SURGERY_CENTER): Payer: 59 | Admitting: Internal Medicine

## 2013-06-04 ENCOUNTER — Encounter: Payer: Self-pay | Admitting: Internal Medicine

## 2013-06-04 VITALS — BP 100/73 | HR 66 | Temp 97.3°F | Resp 19 | Ht 67.0 in | Wt 134.0 lb

## 2013-06-04 DIAGNOSIS — K2970 Gastritis, unspecified, without bleeding: Secondary | ICD-10-CM

## 2013-06-04 DIAGNOSIS — D126 Benign neoplasm of colon, unspecified: Secondary | ICD-10-CM

## 2013-06-04 DIAGNOSIS — R1013 Epigastric pain: Secondary | ICD-10-CM

## 2013-06-04 DIAGNOSIS — R112 Nausea with vomiting, unspecified: Secondary | ICD-10-CM

## 2013-06-04 DIAGNOSIS — Z1211 Encounter for screening for malignant neoplasm of colon: Secondary | ICD-10-CM

## 2013-06-04 DIAGNOSIS — K2990 Gastroduodenitis, unspecified, without bleeding: Secondary | ICD-10-CM

## 2013-06-04 MED ORDER — SODIUM CHLORIDE 0.9 % IV SOLN: 500.0000 mL | INTRAVENOUS | Status: DC

## 2013-06-04 NOTE — Patient Instructions (Signed)
Discharge instructions given with verbal understanding. Handout on gastritis. Resume previous medications. YOU HAD AN ENDOSCOPIC PROCEDURE TODAY AT THE [facility]: Refer to the procedure report that was given to you for any specific questions about what was found during the examination.  If the procedure report does not answer your questions, please call your gastroenterologist to clarify.  If you requested that your care partner not be given the details of your procedure findings, then the procedure report has been included in a sealed envelope for you to review at your convenience later.  YOU SHOULD EXPECT: Some feelings of bloating in the abdomen. Passage of more gas than usual.  Walking can help get rid of the air that was put into your GI tract during the procedure and reduce the bloating. If you had a lower endoscopy (such as a colonoscopy or flexible sigmoidoscopy) you may notice spotting of blood in your stool or on the toilet paper. If you underwent a bowel prep for your procedure, then you may not have a normal bowel movement for a few days.  DIET: Your first meal following the procedure should be a light meal and then it is ok to progress to your normal diet.  A half-sandwich or bowl of soup is an example of a good first meal.  Heavy or fried foods are harder to digest and may make you feel nauseous or bloated.  Likewise meals heavy in dairy and vegetables can cause extra gas to form and this can also increase the bloating.  Drink plenty of fluids but you should avoid alcoholic beverages for 24 hours.  ACTIVITY: Your care partner should take you home directly after the procedure.  You should plan to take it easy, moving slowly for the rest of the day.  You can resume normal activity the day after the procedure however you should NOT DRIVE or use heavy machinery for 24 hours (because of the sedation medicines used during the test).    SYMPTOMS TO REPORT IMMEDIATELY: A  gastroenterologist can be reached at any hour.  During normal business hours, 8:30 AM to 5:00 PM Monday through Friday, call [phone].  After hours and on weekends, please call the GI answering service at [phone] who will take a message and have the physician on call contact you.   Following lower endoscopy (colonoscopy or flexible sigmoidoscopy):  Excessive amounts of blood in the stool  Significant tenderness or worsening of abdominal pains  Swelling of the abdomen that is new, acute  Fever of 100F or higher  FOLLOW UP: If any biopsies were taken you will be contacted by phone or by letter within the next 1-3 weeks.  Call your gastroenterologist if you have not heard about the biopsies in 3 weeks.  Our staff will call the home number listed on your records the next business day following your procedure to check on you and address any questions or concerns that you may have at that time regarding the information given to you following your procedure. This is a courtesy call and so if there is no answer at the home number and we have not heard from you through the emergency physician on call, we will assume that you have returned to your regular daily activities without incident.  SIGNATURES/CONFIDENTIALITY: You and/or your care partner have signed paperwork which will be entered into your electronic medical record.  These signatures attest to the fact that that the information above on your After Visit Summary has been  reviewed and is understood.  Full responsibility of the confidentiality of this discharge information lies with you and/or your care-partner.

## 2013-06-04 NOTE — Progress Notes (Signed)
Patient did not experience any of the following events: a burn prior to discharge; a fall within the facility; wrong site/side/patient/procedure/implant event; or a hospital transfer or hospital admission upon discharge from the facility. (G8907) Patient did not have preoperative order for IV antibiotic SSI prophylaxis. (G8918)  

## 2013-06-04 NOTE — Op Note (Signed)
[facility] [address] [place], [address]   COLONOSCOPY PROCEDURE REPORT  PATIENT: Theresa Pham, Theresa Pham  MR#: [id number] BIRTHDATE: 06-08-1961 , 52  yrs. old GENDER: Female ENDOSCOPIST: Beverley Fiedler, MD PROCEDURE DATE:  06/04/2013 PROCEDURE:   Colonoscopy, screening First Screening Colonoscopy - Avg.  risk and is 50 yrs.  old or older Yes.  Prior Negative Screening - Now for repeat screening. N/A  History of Adenoma - Now for follow-up colonoscopy & has been > or = to 3 yrs.  N/A  Polyps Removed Today? No.  Recommend repeat exam, <10 yrs? No. ASA CLASS:   Class II INDICATIONS:average risk screening and first colonoscopy. MEDICATIONS: MAC sedation, administered by CRNA and Propofol (Diprivan) 180 mg IV  DESCRIPTION OF PROCEDURE:   After the risks benefits and alternatives of the procedure were thoroughly explained, informed consent was obtained.  A digital rectal exam revealed no rectal mass.   The LB [id number]  endoscope was introduced through the anus and advanced to the cecum, which was identified by both the appendix and ileocecal valve. No adverse events experienced. The quality of the prep was good, using MoviPrep  The instrument was then slowly withdrawn as the colon was fully examined.     COLON FINDINGS: A normal appearing cecum, ileocecal valve, and appendiceal orifice were identified.  The ascending, hepatic flexure, transverse, splenic flexure, descending, sigmoid colon and rectum appeared unremarkable.  No polyps or cancers were seen. Retroflexed views revealed no abnormalities. The time to cecum=7 minutes 23 seconds.  Withdrawal time=11 minutes 37 seconds.  The scope was withdrawn and the procedure completed.  COMPLICATIONS: There were no complications.  ENDOSCOPIC IMPRESSION: Normal colon  RECOMMENDATIONS: You should continue to follow colorectal cancer screening guidelines for "routine risk" patients with a repeat colonoscopy in  10 years. There is no need for FOBT (stool) testing for at least 5 years.   eSigned:  Beverley Fiedler, MD 06/04/2013 3:10 PM   cc: The Patient and Rudi Heap, MD

## 2013-06-04 NOTE — Progress Notes (Signed)
Report to pacu rn, vss, bbs=clear 

## 2013-06-04 NOTE — Progress Notes (Signed)
Called to room to assist during endoscopic procedure.  Patient ID and intended procedure confirmed with present staff. Received instructions for my participation in the procedure from the performing physician. ewm 

## 2013-06-04 NOTE — Op Note (Signed)
[facility] [address] [place], [address]   ENDOSCOPY PROCEDURE REPORT  PATIENT: Theresa, Pham  MR#: [id number] BIRTHDATE: 06-Jan-1961 , 52  yrs. old GENDER: Female ENDOSCOPIST: Beverley Fiedler, MD REFERRED BY:  Rudi Heap, M.D. PROCEDURE DATE:  06/04/2013 PROCEDURE:  EGD w/ biopsy for H.pylori ASA CLASS:     Class II INDICATIONS:  Nausea.   Vomiting.   Epigastric pain. MEDICATIONS: MAC sedation, administered by CRNA and propofol (Diprivan) 200mg  IV TOPICAL ANESTHETIC: Cetacaine Spray  DESCRIPTION OF PROCEDURE: After the risks benefits and alternatives of the procedure were thoroughly explained, informed consent was obtained.  The LB [id number] endoscope was introduced through the mouth and advanced to the second portion of the duodenum. Without limitations.  The instrument was slowly withdrawn as the mucosa was fully examined.    ESOPHAGUS: The mucosa of the esophagus appeared normal.  STOMACH: Mild gastritis (inflammation) with scant adherent heme was found in the gastric antrum.  Biopsies were taken in the antrum, angularis and gastric body.  DUODENUM: The duodenal mucosa showed no abnormalities in the bulb and second portion of the duodenum. Retroflexed views revealed no abnormalities.     The scope was then withdrawn from the patient and the procedure completed.  COMPLICATIONS: There were no complications.  ENDOSCOPIC IMPRESSION: 1.   The mucosa of the esophagus appeared normal 2.   Gastritis (inflammation) was found in the gastric antrum; biopsies to exclude H. Pylori 3.   The duodenal mucosa showed no abnormalities in the bulb and second portion of the duodenum  RECOMMENDATIONS: 1.  Await pathology results 2.  Continue current medications 3.  Follow-up of helicobacter pylori status, treat if indicated 4.  Referral to [facility] for possible cholecystectomy  eSigned:  Beverley Fiedler, MD 06/04/2013 3:06 PM CC:The  Patient and Rudi Heap, MD

## 2013-06-05 ENCOUNTER — Telehealth: Payer: Self-pay

## 2013-06-05 NOTE — Telephone Encounter (Signed)
  Follow up Call-  Call back number 06/04/2013  Post procedure Call Back phone  # [phone]  Permission to leave phone message Yes     Patient questions:  Do you have a fever, pain , or abdominal swelling? no Pain Score  0 *  Have you tolerated food without any problems? yes  Have you been able to return to your normal activities? yes  Do you have any questions about your discharge instructions: Diet   no Medications  no Follow up visit  no  Do you have questions or concerns about your Care? no  Actions: * If pain score is 4 or above: No action needed, pain <4.

## 2013-06-07 ENCOUNTER — Encounter: Payer: Self-pay | Admitting: Internal Medicine

## 2013-06-08 ENCOUNTER — Telehealth: Payer: Self-pay | Admitting: *Deleted

## 2013-06-08 DIAGNOSIS — R112 Nausea with vomiting, unspecified: Secondary | ICD-10-CM

## 2013-06-08 DIAGNOSIS — R1013 Epigastric pain: Secondary | ICD-10-CM

## 2013-06-08 NOTE — Telephone Encounter (Signed)
Informed pt of appt with Dr Derrell Lolling at [facility] on 06/14/13 at 0900; pt stated understanding.

## 2013-06-14 ENCOUNTER — Ambulatory Visit (INDEPENDENT_AMBULATORY_CARE_PROVIDER_SITE_OTHER): Payer: 59 | Admitting: General Surgery

## 2013-06-14 ENCOUNTER — Other Ambulatory Visit: Payer: Self-pay

## 2013-06-14 ENCOUNTER — Encounter (INDEPENDENT_AMBULATORY_CARE_PROVIDER_SITE_OTHER): Payer: Self-pay | Admitting: General Surgery

## 2013-06-14 VITALS — BP 126/70 | HR 84 | Resp 20 | Ht 67.0 in | Wt 132.4 lb

## 2013-06-14 DIAGNOSIS — K8020 Calculus of gallbladder without cholecystitis without obstruction: Secondary | ICD-10-CM

## 2013-06-14 NOTE — Progress Notes (Signed)
Patient ID: Theresa Pham, female   DOB: 02/07/1961, 52 y.o.   MRN: [id number]  Chief Complaint  Patient presents with  . New Evaluation    eval for poss chole    HPI Theresa Pham is a 52 y.o. female.  The patient is a 52-year-old female referred by Dr. Pirtle for evaluation of symptomatic gallstones. The patient has been having cold sweats and waking up at the proximal O500. She states it subsequent to this she has nausea and vomiting yesterday.  The patient underwent EEG which showed some mild gastritis. Patient also ultrasound which revealed multiple gallstones. HPI  Past Medical History  Diagnosis Date  . Depression   . Anxiety   . Hx of migraine headaches   . Hyperlipidemia   . IBS (irritable bowel syndrome)   . Kidney stone 1993    x 1  . Anemia     Past Surgical History  Procedure Laterality Date  . Vaginal hysterectomy      has her right ovary  . Lasik  2006, 2012    Family History  Problem Relation Age of Onset  . Hypertension Mother   . Uterine cancer Mother   . Cancer Mother     uterine  . Heart attack Father 69  . Colon cancer Neg Hx   . Breast cancer Maternal Aunt   . Cancer Maternal Aunt     breast    Social History History  Substance Use Topics  . Smoking status: Former Smoker    Types: Cigarettes  . Smokeless tobacco: Never Used  . Alcohol Use: Yes     Comment: rare, social    Allergies  Allergen Reactions  . Ampicillin Rash  . Azithromycin Diarrhea  . Penicillins Rash  . Codeine Nausea Only    Current Outpatient Prescriptions  Medication Sig Dispense Refill  . Aspirin-Acetaminophen-Caffeine (GOODY HEADACHE PO) Take by mouth as needed.      . cholecalciferol (VITAMIN D) 1000 UNITS tablet Take 1,000 Units by mouth daily.      . diazepam (VALIUM) 5 MG tablet Take 1 tablet (5 mg total) by mouth every 8 (eight) hours as needed for anxiety.  90 tablet  0  . diphenhydrAMINE (BENADRYL) 25 MG tablet Take 25 mg by mouth every 6 (six)  hours as needed for allergies or sleep.      . Ginger, Zingiber officinalis, (GINGER ROOT PO) Take 1 capsule by mouth. As needed for stomach upset      . KRILL OIL PO Take by mouth.      . meclizine (ANTIVERT) 25 MG tablet Take 25 mg by mouth 3 (three) times daily as needed for dizziness.      . milk thistle 175 MG tablet Take 175 mg by mouth daily.      . MOVIPREP 100 G SOLR Use per prep instruction  1 kit  0  . pseudoephedrine (SUDAFED) 30 MG tablet Take 60 mg by mouth every 4 (four) hours as needed for congestion.      . SUMAtriptan (IMITREX) 100 MG tablet May take 100mg once, then repeat after two hours if needed. Not more than 200mg in 24 hours.  10 tablet  0  . venlafaxine XR (EFFEXOR-XR) 75 MG 24 hr capsule Take 1 capsule (75 mg total) by mouth daily.  30 capsule  3   No current facility-administered medications for this visit.    Review of Systems Review of Systems  Constitutional: Negative.   HENT: Negative.     Respiratory: Negative.   Cardiovascular: Negative.   Gastrointestinal: Negative.   Neurological: Negative.   All other systems reviewed and are negative.    Blood pressure 126/70, pulse 84, resp. rate 20, height 5' 7" (1.702 m), weight 132 lb 6.4 oz (60.056 kg).  Physical Exam Physical Exam  Constitutional: She is oriented to person, place, and time. She appears well-developed and well-nourished.  HENT:  Head: Normocephalic and atraumatic.  Eyes: Conjunctivae and EOM are normal. Pupils are equal, round, and reactive to light.  Neck: Normal range of motion.  Cardiovascular: Normal rate, regular rhythm and normal heart sounds.   Pulmonary/Chest: Effort normal and breath sounds normal.  Abdominal: Soft. Bowel sounds are normal. She exhibits no distension. There is tenderness (RUQ). There is no rebound and no guarding.  Musculoskeletal: Normal range of motion.  Neurological: She is alert and oriented to person, place, and time.  Skin: Skin is warm and dry.    Data  Reviewed As above Ultrasound gallstones  Assessment    52-year-old female with sentiment cholelithiasis     Plan    1 We will proceed to the operating room for laparoscopic cholecystectomy with no IOC 2.All risks and benefits were discussed with the patient to generally include: infection, bleeding, possible need for post op ERCP, damage to the bile ducts, and bile leak. Alternatives were offered and described.  All questions were answered and the patient voiced understanding of the procedure and wishes to proceed at this point with a laparoscopic cholecystectomy         Theresa Michaelis Jr., Theresa Pham 06/14/2013, 9:35 AM    

## 2013-06-19 ENCOUNTER — Encounter (HOSPITAL_COMMUNITY): Payer: Self-pay | Admitting: Pharmacy Technician

## 2013-06-20 ENCOUNTER — Other Ambulatory Visit (HOSPITAL_COMMUNITY): Payer: Self-pay | Admitting: General Surgery

## 2013-06-20 NOTE — Patient Instructions (Addendum)
20 KARLENA LUEBKE  06/20/2013   Your procedure is scheduled on: Friday November 14th  Report to [facility] at 945 AM.  Call this number if you have problems the morning of surgery [phone]   Remember:   Do not eat food or drink liquids :After Midnight.    Take these medicines the morning of surgery with A SIP OF WATER: diazepam if needed                                SEE [facility] PREPARING FOR SURGERY SHEET             You may not have any metal on your body including hair pins and piercings  Do not wear jewelry, make-up.  Do not wear lotions, powders, or perfumes. You may wear deodorant.   Men may shave face and neck.  Do not bring valuables to the hospital. [facility] IS NOT RESPONSIBLE FOR VALUEABLES.   Patients discharged the day of surgery will not be allowed to drive home.  Name and phone number of your driver: Nadine Counts [phone]   Call Birdie Sons  RN pre op nurse if needed [phone]    FAILURE TO FOLLOW THESE INSTRUCTIONS MAY RESULT IN THE CANCELLATION OF YOUR SURGERY.  PATIENT SIGNATURE___________________________________________  NURSE SIGNATURE_____________________________________________

## 2013-06-21 ENCOUNTER — Encounter (HOSPITAL_COMMUNITY)
Admission: RE | Admit: 2013-06-21 | Discharge: 2013-06-21 | Disposition: A | Discharge status: Home / Self Care | Payer: 59 | Source: Ambulatory Visit | Attending: General Surgery | Admitting: General Surgery

## 2013-06-21 ENCOUNTER — Encounter (HOSPITAL_COMMUNITY): Payer: Self-pay

## 2013-06-21 HISTORY — DX: Headache: R51

## 2013-06-21 HISTORY — DX: Dislocation of jaw, unspecified side, initial encounter: S03.00XA

## 2013-06-21 HISTORY — DX: Personal history of urinary calculi: Z87.442

## 2013-06-21 HISTORY — DX: Unspecified osteoarthritis, unspecified site: M19.90

## 2013-06-21 LAB — CBC
HCT: 39.2 % (ref 36.0–46.0)
Hemoglobin: 12.9 g/dL (ref 12.0–15.0)
MCH: 31.6 pg (ref 26.0–34.0)
MCV: 96.1 fL (ref 78.0–100.0)
Platelets: 345 10*3/uL (ref 150–400)
RBC: 4.08 MIL/uL (ref 3.87–5.11)
RDW: 13.7 % (ref 11.5–15.5)
WBC: 10.5 10*3/uL (ref 4.0–10.5)

## 2013-06-22 ENCOUNTER — Encounter (HOSPITAL_COMMUNITY): Payer: Self-pay | Admitting: *Deleted

## 2013-06-22 ENCOUNTER — Ambulatory Visit (HOSPITAL_COMMUNITY): Payer: 59

## 2013-06-22 ENCOUNTER — Ambulatory Visit (HOSPITAL_COMMUNITY): Payer: 59 | Admitting: Anesthesiology

## 2013-06-22 ENCOUNTER — Ambulatory Visit (HOSPITAL_COMMUNITY)
Admission: RE | Admit: 2013-06-22 | Discharge: 2013-06-22 | Disposition: A | Discharge status: Home / Self Care | Payer: 59 | Source: Ambulatory Visit | Attending: General Surgery | Admitting: General Surgery

## 2013-06-22 ENCOUNTER — Encounter (HOSPITAL_COMMUNITY): Payer: 59 | Admitting: Anesthesiology

## 2013-06-22 ENCOUNTER — Encounter (HOSPITAL_COMMUNITY)
Admission: RE | Disposition: A | Discharge status: Home / Self Care | Payer: Self-pay | Source: Ambulatory Visit | Attending: General Surgery

## 2013-06-22 DIAGNOSIS — E785 Hyperlipidemia, unspecified: Secondary | ICD-10-CM | POA: Insufficient documentation

## 2013-06-22 DIAGNOSIS — K8010 Calculus of gallbladder with chronic cholecystitis without obstruction: Secondary | ICD-10-CM | POA: Insufficient documentation

## 2013-06-22 DIAGNOSIS — Z01812 Encounter for preprocedural laboratory examination: Secondary | ICD-10-CM | POA: Insufficient documentation

## 2013-06-22 DIAGNOSIS — Z87891 Personal history of nicotine dependence: Secondary | ICD-10-CM | POA: Insufficient documentation

## 2013-06-22 DIAGNOSIS — Z79899 Other long term (current) drug therapy: Secondary | ICD-10-CM | POA: Insufficient documentation

## 2013-06-22 HISTORY — PX: CHOLECYSTECTOMY: SHX55

## 2013-06-22 SURGERY — LAPAROSCOPIC CHOLECYSTECTOMY
Anesthesia: General | Site: Abdomen | Wound class: Clean Contaminated

## 2013-06-22 MED ORDER — LIDOCAINE HCL (CARDIAC) 20 MG/ML IV SOLN
INTRAVENOUS | Status: DC | PRN
  Administered 2013-06-22: 30 mg via INTRAVENOUS
  Administered 2013-06-22: 50 mg via INTRAVENOUS

## 2013-06-22 MED ORDER — HYDROMORPHONE HCL PF 1 MG/ML IJ SOLN
INTRAMUSCULAR | Status: AC
  Filled 2013-06-22: qty 1

## 2013-06-22 MED ORDER — GLYCOPYRROLATE 0.2 MG/ML IJ SOLN
INTRAMUSCULAR | Status: DC | PRN
  Administered 2013-06-22: 0.4 mg via INTRAVENOUS

## 2013-06-22 MED ORDER — MIDAZOLAM HCL 5 MG/5ML IJ SOLN
INTRAMUSCULAR | Status: DC | PRN
  Administered 2013-06-22: 2 mg via INTRAVENOUS

## 2013-06-22 MED ORDER — KETOROLAC TROMETHAMINE 30 MG/ML IJ SOLN
INTRAMUSCULAR | Status: DC | PRN
  Administered 2013-06-22: 30 mg via INTRAVENOUS

## 2013-06-22 MED ORDER — OXYCODONE-ACETAMINOPHEN 5-325 MG PO TABS: 1.0000 | ORAL_TABLET | ORAL | Status: DC | PRN

## 2013-06-22 MED ORDER — BUPIVACAINE-EPINEPHRINE PF 0.25-1:200000 % IJ SOLN
INTRAMUSCULAR | Status: AC
  Filled 2013-06-22: qty 30

## 2013-06-22 MED ORDER — ONDANSETRON HCL 4 MG/2ML IJ SOLN
INTRAMUSCULAR | Status: DC | PRN
  Administered 2013-06-22: 4 mg via INTRAVENOUS

## 2013-06-22 MED ORDER — DEXAMETHASONE SODIUM PHOSPHATE 10 MG/ML IJ SOLN
INTRAMUSCULAR | Status: DC | PRN
  Administered 2013-06-22: 10 mg via INTRAVENOUS

## 2013-06-22 MED ORDER — HYDROMORPHONE HCL PF 1 MG/ML IJ SOLN
0.2500 mg | INTRAMUSCULAR | Status: DC | PRN
  Administered 2013-06-22 (×2): 0.25 mg via INTRAVENOUS

## 2013-06-22 MED ORDER — CIPROFLOXACIN IN D5W 400 MG/200ML IV SOLN
INTRAVENOUS | Status: AC
  Filled 2013-06-22: qty 200

## 2013-06-22 MED ORDER — BUPIVACAINE-EPINEPHRINE PF 0.25-1:200000 % IJ SOLN
INTRAMUSCULAR | Status: DC | PRN
  Administered 2013-06-22: 10 mL

## 2013-06-22 MED ORDER — FENTANYL CITRATE 0.05 MG/ML IJ SOLN
INTRAMUSCULAR | Status: DC | PRN
  Administered 2013-06-22 (×3): 50 ug via INTRAVENOUS

## 2013-06-22 MED ORDER — LACTATED RINGERS IR SOLN
Status: DC | PRN
  Administered 2013-06-22: 1000 mL

## 2013-06-22 MED ORDER — BUPIVACAINE-EPINEPHRINE PF 0.5-1:200000 % IJ SOLN
INTRAMUSCULAR | Status: AC
  Filled 2013-06-22: qty 30

## 2013-06-22 MED ORDER — EPHEDRINE SULFATE 50 MG/ML IJ SOLN
INTRAMUSCULAR | Status: DC | PRN
  Administered 2013-06-22: 10 mg via INTRAVENOUS

## 2013-06-22 MED ORDER — ROCURONIUM BROMIDE 100 MG/10ML IV SOLN
INTRAVENOUS | Status: DC | PRN
  Administered 2013-06-22: 30 mg via INTRAVENOUS

## 2013-06-22 MED ORDER — 0.9 % SODIUM CHLORIDE (POUR BTL) OPTIME
TOPICAL | Status: DC | PRN
  Administered 2013-06-22: 1000 mL

## 2013-06-22 MED ORDER — PROMETHAZINE HCL 25 MG/ML IJ SOLN: 6.2500 mg | INTRAMUSCULAR | Status: DC | PRN

## 2013-06-22 MED ORDER — CIPROFLOXACIN IN D5W 400 MG/200ML IV SOLN
400.0000 mg | INTRAVENOUS | Status: AC
  Administered 2013-06-22: 400 mg via INTRAVENOUS

## 2013-06-22 MED ORDER — PROPOFOL 10 MG/ML IV BOLUS
INTRAVENOUS | Status: DC | PRN
  Administered 2013-06-22: 140 mg via INTRAVENOUS
  Administered 2013-06-22: 50 mg via INTRAVENOUS

## 2013-06-22 MED ORDER — LACTATED RINGERS IV SOLN
INTRAVENOUS | Status: DC
  Administered 2013-06-22: 13:00:00 via INTRAVENOUS
  Administered 2013-06-22: 1000 mL via INTRAVENOUS

## 2013-06-22 MED ORDER — OXYCODONE HCL 5 MG PO TABS
5.0000 mg | ORAL_TABLET | ORAL | Status: DC | PRN
  Administered 2013-06-22: 5 mg via ORAL
  Filled 2013-06-22: qty 1

## 2013-06-22 MED ORDER — NEOSTIGMINE METHYLSULFATE 1 MG/ML IJ SOLN
INTRAMUSCULAR | Status: DC | PRN
  Administered 2013-06-22: 3 mg via INTRAVENOUS

## 2013-06-22 SURGICAL SUPPLY — 44 items
APL SKNCLS STERI-STRIP NONHPOA (GAUZE/BANDAGES/DRESSINGS) ×1
APPLIER CLIP 5 13 M/L LIGAMAX5 (MISCELLANEOUS)
APR CLP MED LRG 5 ANG JAW (MISCELLANEOUS)
BAG SPEC RTRVL LRG 6X4 10 (ENDOMECHANICALS)
BENZOIN TINCTURE PRP APPL 2/3 (GAUZE/BANDAGES/DRESSINGS) ×1 IMPLANT
CABLE HIGH FREQUENCY MONO STRZ (ELECTRODE) ×2 IMPLANT
CANISTER SUCTION 2500CC (MISCELLANEOUS) ×2 IMPLANT
CHLORAPREP W/TINT 26ML (MISCELLANEOUS) ×2 IMPLANT
CLIP APPLIE 5 13 M/L LIGAMAX5 (MISCELLANEOUS) IMPLANT
CLIP LIGATING HEMO O LOK GREEN (MISCELLANEOUS) ×2 IMPLANT
CLIP LIGATING HEMOLOK MED (MISCELLANEOUS) IMPLANT
COVER MAYO STAND STRL (DRAPES) IMPLANT
COVER TRANSDUCER ULTRASND (DRAPES) ×1 IMPLANT
DECANTER SPIKE VIAL GLASS SM (MISCELLANEOUS) ×1 IMPLANT
DEVICE TROCAR PUNCTURE CLOSURE (ENDOMECHANICALS) ×1 IMPLANT
DRAPE C-ARM 42X120 X-RAY (DRAPES) IMPLANT
DRAPE LAPAROSCOPIC ABDOMINAL (DRAPES) ×2 IMPLANT
DRAPE UTILITY XL STRL (DRAPES) ×2 IMPLANT
ELECT REM PT RETURN 9FT ADLT (ELECTROSURGICAL) ×2
ELECTRODE REM PT RTRN 9FT ADLT (ELECTROSURGICAL) ×1 IMPLANT
GAUZE SPONGE 2X2 8PLY STRL LF (GAUZE/BANDAGES/DRESSINGS) ×1 IMPLANT
GLOVE BIO SURGEON STRL SZ7.5 (GLOVE) ×2 IMPLANT
GOWN STRL REIN XL XLG (GOWN DISPOSABLE) ×4 IMPLANT
HEMOSTAT SURGICEL 4X8 (HEMOSTASIS) IMPLANT
KIT BASIN OR (CUSTOM PROCEDURE TRAY) ×2 IMPLANT
NDL INSUFFLATION 14GA 120MM (NEEDLE) ×1 IMPLANT
NEEDLE INSUFFLATION 14GA 120MM (NEEDLE) ×2 IMPLANT
NS IRRIG 1000ML POUR BTL (IV SOLUTION) ×1 IMPLANT
POUCH SPECIMEN RETRIEVAL 10MM (ENDOMECHANICALS) IMPLANT
SCISSORS LAP 5X35 DISP (ENDOMECHANICALS) ×2 IMPLANT
SET CHOLANGIOGRAPH MIX (MISCELLANEOUS) IMPLANT
SET IRRIG TUBING LAPAROSCOPIC (IRRIGATION / IRRIGATOR) ×2 IMPLANT
SOLUTION ANTI FOG 6CC (MISCELLANEOUS) ×2 IMPLANT
SPONGE GAUZE 2X2 STER 10/PKG (GAUZE/BANDAGES/DRESSINGS) ×1
SPONGE GAUZE 4X4 12PLY (GAUZE/BANDAGES/DRESSINGS) ×1 IMPLANT
STRIP CLOSURE SKIN 1/2X4 (GAUZE/BANDAGES/DRESSINGS) ×1 IMPLANT
SUT MNCRL AB 4-0 PS2 18 (SUTURE) ×2 IMPLANT
TOWEL OR 17X26 10 PK STRL BLUE (TOWEL DISPOSABLE) ×2 IMPLANT
TOWEL OR NON WOVEN STRL DISP B (DISPOSABLE) ×2 IMPLANT
TRAY LAP CHOLE (CUSTOM PROCEDURE TRAY) ×2 IMPLANT
TROCAR BLADELESS OPT 5 75 (ENDOMECHANICALS) ×2 IMPLANT
TROCAR SLEEVE XCEL 5X75 (ENDOMECHANICALS) ×3 IMPLANT
TROCAR XCEL NON-BLD 11X100MML (ENDOMECHANICALS) ×2 IMPLANT
TUBING INSUFFLATION 10FT LAP (TUBING) ×2 IMPLANT

## 2013-06-22 NOTE — Preoperative (Signed)
Beta Blockers   Reason not to administer Beta Blockers:Not Applicable 

## 2013-06-22 NOTE — Anesthesia Preprocedure Evaluation (Signed)
Anesthesia Evaluation  Patient identified by MRN, date of birth, ID band Patient awake    Reviewed: Allergy & Precautions, H&P , NPO status , Patient's Chart, lab work & pertinent test results  Airway Mallampati: II TM Distance: >3 FB Neck ROM: Full   Comment: H/O TMJ with dislocation. Dental no notable dental hx.    Pulmonary former smoker,  breath sounds clear to auscultation  Pulmonary exam normal       Cardiovascular negative cardio ROS  Rhythm:Regular Rate:Normal     Neuro/Psych  Headaches, PSYCHIATRIC DISORDERS Anxiety    GI/Hepatic negative GI ROS, Neg liver ROS,   Endo/Other  negative endocrine ROS  Renal/GU negative Renal ROS  negative genitourinary   Musculoskeletal negative musculoskeletal ROS (+)   Abdominal   Peds negative pediatric ROS (+)  Hematology  (+) anemia ,   Anesthesia Other Findings   Reproductive/Obstetrics negative OB ROS                           Anesthesia Physical Anesthesia Plan  ASA: II  Anesthesia Plan: General   Post-op Pain Management:    Induction: Intravenous  Airway Management Planned: Oral ETT  Additional Equipment:   Intra-op Plan:   Post-operative Plan: Extubation in OR  Informed Consent: I have reviewed the patients History and Physical, chart, labs and discussed the procedure including the risks, benefits and alternatives for the proposed anesthesia with the patient or authorized representative who has indicated his/her understanding and acceptance.   Dental advisory given  Plan Discussed with: CRNA  Anesthesia Plan Comments:         Anesthesia Quick Evaluation

## 2013-06-22 NOTE — H&P (View-Only) (Signed)
Patient ID: OZIE DIMARIA, female   DOB: Mar 02, 1961, 52 y.o.   MRN: [id number]  Chief Complaint  Patient presents with  . New Evaluation    eval for poss chole    HPI ZYARA RILING is a 52 y.o. female.  The patient is a 52 year old female referred by Dr. Sharla Kidney for evaluation of symptomatic gallstones. The patient has been having cold sweats and waking up at the proximal O500. She states it subsequent to this she has nausea and vomiting yesterday.  The patient underwent EEG which showed some mild gastritis. Patient also ultrasound which revealed multiple gallstones. HPI  Past Medical History  Diagnosis Date  . Depression   . Anxiety   . Hx of migraine headaches   . Hyperlipidemia   . IBS (irritable bowel syndrome)   . Kidney stone 1993    x 1  . Anemia     Past Surgical History  Procedure Laterality Date  . Vaginal hysterectomy      has her right ovary  . Lasik  2006, 2012    Family History  Problem Relation Age of Onset  . Hypertension Mother   . Uterine cancer Mother   . Cancer Mother     uterine  . Heart attack Father 61  . Colon cancer Neg Hx   . Breast cancer Maternal Aunt   . Cancer Maternal Aunt     breast    Social History History  Substance Use Topics  . Smoking status: Former Smoker    Types: Cigarettes  . Smokeless tobacco: Never Used  . Alcohol Use: Yes     Comment: rare, social    Allergies  Allergen Reactions  . Ampicillin Rash  . Azithromycin Diarrhea  . Penicillins Rash  . Codeine Nausea Only    Current Outpatient Prescriptions  Medication Sig Dispense Refill  . Aspirin-Acetaminophen-Caffeine (GOODY HEADACHE PO) Take by mouth as needed.      . cholecalciferol (VITAMIN D) 1000 UNITS tablet Take 1,000 Units by mouth daily.      . diazepam (VALIUM) 5 MG tablet Take 1 tablet (5 mg total) by mouth every 8 (eight) hours as needed for anxiety.  90 tablet  0  . diphenhydrAMINE (BENADRYL) 25 MG tablet Take 25 mg by mouth every 6 (six)  hours as needed for allergies or sleep.      . Ginger, Zingiber officinalis, (GINGER ROOT PO) Take 1 capsule by mouth. As needed for stomach upset      . KRILL OIL PO Take by mouth.      . meclizine (ANTIVERT) 25 MG tablet Take 25 mg by mouth 3 (three) times daily as needed for dizziness.      . milk thistle 175 MG tablet Take 175 mg by mouth daily.      Marland Kitchen MOVIPREP 100 G SOLR Use per prep instruction  1 kit  0  . pseudoephedrine (SUDAFED) 30 MG tablet Take 60 mg by mouth every 4 (four) hours as needed for congestion.      . SUMAtriptan (IMITREX) 100 MG tablet May take 100mg  once, then repeat after two hours if needed. Not more than 200mg  in 24 hours.  10 tablet  0  . venlafaxine XR (EFFEXOR-XR) 75 MG 24 hr capsule Take 1 capsule (75 mg total) by mouth daily.  30 capsule  3   No current facility-administered medications for this visit.    Review of Systems Review of Systems  Constitutional: Negative.   HENT: Negative.  Respiratory: Negative.   Cardiovascular: Negative.   Gastrointestinal: Negative.   Neurological: Negative.   All other systems reviewed and are negative.    Blood pressure 126/70, pulse 84, resp. rate 20, height 5\' 7"  (1.702 m), weight 132 lb 6.4 oz (60.056 kg).  Physical Exam Physical Exam  Constitutional: She is oriented to person, place, and time. She appears well-developed and well-nourished.  HENT:  Head: Normocephalic and atraumatic.  Eyes: Conjunctivae and EOM are normal. Pupils are equal, round, and reactive to light.  Neck: Normal range of motion.  Cardiovascular: Normal rate, regular rhythm and normal heart sounds.   Pulmonary/Chest: Effort normal and breath sounds normal.  Abdominal: Soft. Bowel sounds are normal. She exhibits no distension. There is tenderness (RUQ). There is no rebound and no guarding.  Musculoskeletal: Normal range of motion.  Neurological: She is alert and oriented to person, place, and time.  Skin: Skin is warm and dry.    Data  Reviewed As above Ultrasound gallstones  Assessment    52 year old female with sentiment cholelithiasis     Plan    1 We will proceed to the operating room for laparoscopic cholecystectomy with no IOC 2.All risks and benefits were discussed with the patient to generally include: infection, bleeding, possible need for post op ERCP, damage to the bile ducts, and bile leak. Alternatives were offered and described.  All questions were answered and the patient voiced understanding of the procedure and wishes to proceed at this point with a laparoscopic cholecystectomy         Marigene Ehlers., [place] 06/14/2013, 9:35 AM

## 2013-06-22 NOTE — Anesthesia Postprocedure Evaluation (Signed)
  Anesthesia Post-op Note  Patient: Theresa Pham  Procedure(s) Performed: Procedure(s) (LRB): LAPAROSCOPIC CHOLECYSTECTOMY (N/A)  Patient Location: PACU  Anesthesia Type: General  Level of Consciousness: awake and alert   Airway and Oxygen Therapy: Patient Spontanous Breathing  Post-op Pain: mild  Post-op Assessment: Post-op Vital signs reviewed, Patient's Cardiovascular Status Stable, Respiratory Function Stable, Patent Airway and No signs of Nausea or vomiting  Last Vitals:  Filed Vitals:   06/22/13 1519  BP: 147/75  Pulse: 66  Temp: 36.4 C  Resp: 18    Post-op Vital Signs: stable   Complications: No apparent anesthesia complications

## 2013-06-22 NOTE — Interval H&P Note (Signed)
History and Physical Interval Note:  06/22/2013 7:08 AM  Theresa Pham  has presented today for surgery, with the diagnosis of GALL STONES   The various methods of treatment have been discussed with the patient and family. After consideration of risks, benefits and other options for treatment, the patient has consented to  Procedure(s): LAPAROSCOPIC CHOLECYSTECTOMY (N/A) as a surgical intervention .  The patient's history has been reviewed, patient examined, no change in status, stable for surgery.  I have reviewed the patient's chart and labs.  Questions were answered to the patient's satisfaction.     Marigene Ehlers., Jed [place]ick

## 2013-06-22 NOTE — Op Note (Signed)
Pre Operative Diagnosis: symptomatic cholelithiasis   Post Operative Diagnosis: same   Procedure: Lap chole   Surgeon: Dr. Axel Filler   Assistant: none   Anesthesia: GETA   EBL: <5cc   Complications: none   Counts: reported as correct x 2   Findings:chronically inflamed gallbladder with multiple gallstones within the gallbladder.  Indications for procedure: Pt is a 52 year old female with RUQ pain and seen to have gallstones. She was counseled in clinic said to have her gallbladder electively removed.  Details of the procedure: The patient was taken to the operating and placed in the supine position with bilateral SCDs in place. A time out was called and all facts were verified. A pneumoperitoneum was obtained via A Veress needle technique to a pressure of 14mm of mercury. A 5mm trochar was then placed in the right upper quadrant under visualization, and there were no injuries to any abdominal organs. A 11 mm port was then placed in the umbilical region after infiltrating with local anesthesia under direct visualization. A second epigastric port was placed under direct visualization. The gallbladder was identified and retracted, the peritoneum was then sharply dissected from the gallbladder and this dissection was carried down to Calot's triangle. The cystic duct was identified and stripped away circumferentially and seen going into the gallbladder 360, the critical angle was obtained. It was noted to be very dilated and large. 2 clips were placed proximally one distally and the cystic duct transected. The cystic artery was identified and 2 clips placed proximally and one distally and transected. We then proceeded to remove the gallbladder off the hepatic fossa with Bovie cautery. A retrieval bag was then placed in the abdomen and gallbladder placed in the bag. The hepatic fossa was then reexamined and hemostasis was achieved with Bovie cautery and was excellent at this portion of the  case. The subhepatic fossa and perihepatic fossa was then irrigated until the effluent was clear. The 11 mm trocar fascia was reapproximated with the Endo Close #1 Vicryl x1. The pneumoperitoneum was evacuated and all trochars removed under direct visulalization. The skin was then closed with 4-0 Monocryl and the skin dressed with Steri-Strips, gauze, and tape. The patient was awaken from general anesthesia and taken to the recovery room in stable condition.

## 2013-06-22 NOTE — Transfer of Care (Signed)
Immediate Anesthesia Transfer of Care Note  Patient: Theresa Pham  Procedure(s) Performed: Procedure(s): LAPAROSCOPIC CHOLECYSTECTOMY (N/A)  Patient Location: PACU  Anesthesia Type:General  Level of Consciousness: awake and patient cooperative  Airway & Oxygen Therapy: Patient Spontanous Breathing and Patient connected to face mask oxygen  Post-op Assessment: Report given to PACU RN and Post -op Vital signs reviewed and stable  Post vital signs: Reviewed and stable  Complications: No apparent anesthesia complications

## 2013-06-25 ENCOUNTER — Encounter (HOSPITAL_COMMUNITY): Payer: Self-pay | Admitting: General Surgery

## 2013-06-27 ENCOUNTER — Other Ambulatory Visit: Payer: Self-pay | Admitting: General Practice

## 2013-06-27 ENCOUNTER — Other Ambulatory Visit: Payer: Self-pay | Admitting: *Deleted

## 2013-06-27 NOTE — Telephone Encounter (Signed)
LAST OV 04/12/13. LAST RF 05/18/13. CALL IN THE DRUG STORE. ROUTE TO NURSE

## 2013-07-02 ENCOUNTER — Other Ambulatory Visit: Payer: Self-pay | Admitting: Family Medicine

## 2013-07-03 ENCOUNTER — Other Ambulatory Visit: Payer: Self-pay | Admitting: General Practice

## 2013-07-03 ENCOUNTER — Encounter (INDEPENDENT_AMBULATORY_CARE_PROVIDER_SITE_OTHER): Payer: Self-pay

## 2013-07-03 ENCOUNTER — Ambulatory Visit (INDEPENDENT_AMBULATORY_CARE_PROVIDER_SITE_OTHER): Payer: 59 | Admitting: General Surgery

## 2013-07-03 ENCOUNTER — Encounter (INDEPENDENT_AMBULATORY_CARE_PROVIDER_SITE_OTHER): Payer: Self-pay | Admitting: General Surgery

## 2013-07-03 VITALS — BP 110/76 | HR 84 | Temp 97.3°F | Resp 16 | Ht 67.0 in | Wt 130.0 lb

## 2013-07-03 DIAGNOSIS — Z9049 Acquired absence of other specified parts of digestive tract: Secondary | ICD-10-CM

## 2013-07-03 DIAGNOSIS — Z9889 Other specified postprocedural states: Secondary | ICD-10-CM

## 2013-07-03 NOTE — Progress Notes (Signed)
Patient ID: Theresa Pham, female   DOB: Dec 11, 1960, 52 y.o.   MRN: [id number] Post op course The patient has been doing well postoperatively. She's had no pain. She's had no bowel dysfunction. Just on a regular diet.  On Exam: Her wounds are clean dry and intact.  Pathology:   Chronic cholecystitis and cholelithiasis.  This was discussed with the patient.  Assessment and Plan A 52 year old female status post laparoscopic cholecystectomy 1. She can follow up as needed 2. Discussed with his restrictions for another month.   Axel Filler, MD [facility] General [facility] Minimally Invasive Surgery Trauma & Emergency Surgery

## 2013-07-04 ENCOUNTER — Other Ambulatory Visit: Payer: Self-pay | Admitting: Family Medicine

## 2013-07-04 ENCOUNTER — Other Ambulatory Visit: Payer: Self-pay | Admitting: General Practice

## 2013-07-04 MED ORDER — DIAZEPAM 5 MG PO TABS: 5.0000 mg | ORAL_TABLET | Freq: Three times a day (TID) | ORAL | Status: DC | PRN

## 2013-07-04 NOTE — Telephone Encounter (Signed)
Called in per dwm 

## 2013-08-12 ENCOUNTER — Other Ambulatory Visit: Payer: Self-pay | Admitting: Family Medicine

## 2013-08-12 ENCOUNTER — Other Ambulatory Visit: Payer: Self-pay | Admitting: General Practice

## 2013-08-13 ENCOUNTER — Other Ambulatory Visit: Payer: Self-pay | Admitting: General Practice

## 2013-08-13 DIAGNOSIS — G43909 Migraine, unspecified, not intractable, without status migrainosus: Secondary | ICD-10-CM

## 2013-08-13 MED ORDER — SUMATRIPTAN SUCCINATE 100 MG PO TABS: ORAL_TABLET | ORAL | Status: DC

## 2013-08-14 ENCOUNTER — Other Ambulatory Visit: Payer: Self-pay | Admitting: Family Medicine

## 2013-08-14 MED ORDER — DIAZEPAM 5 MG PO TABS: ORAL_TABLET | ORAL | Status: DC

## 2013-08-16 ENCOUNTER — Other Ambulatory Visit: Payer: Self-pay

## 2013-08-16 DIAGNOSIS — G43909 Migraine, unspecified, not intractable, without status migrainosus: Secondary | ICD-10-CM

## 2013-08-16 NOTE — Telephone Encounter (Signed)
Last seen 04/12/13  Theresa Pham

## 2013-08-16 NOTE — Telephone Encounter (Signed)
Last seen 04/12/13  Theresa Pham  If approved route to nurse to call into [facility]

## 2013-08-17 ENCOUNTER — Other Ambulatory Visit: Payer: Self-pay | Admitting: General Practice

## 2013-08-17 DIAGNOSIS — G43909 Migraine, unspecified, not intractable, without status migrainosus: Secondary | ICD-10-CM

## 2013-08-17 MED ORDER — SUMATRIPTAN SUCCINATE 100 MG PO TABS: ORAL_TABLET | ORAL | Status: DC

## 2013-08-17 NOTE — Telephone Encounter (Signed)
Imitrex script ready.

## 2013-09-05 ENCOUNTER — Other Ambulatory Visit: Payer: Self-pay | Admitting: General Practice

## 2013-09-05 DIAGNOSIS — G43909 Migraine, unspecified, not intractable, without status migrainosus: Secondary | ICD-10-CM

## 2013-09-06 MED ORDER — SUMATRIPTAN SUCCINATE 100 MG PO TABS: ORAL_TABLET | ORAL | Status: DC

## 2013-09-20 ENCOUNTER — Other Ambulatory Visit: Payer: Self-pay | Admitting: Family Medicine

## 2013-09-24 MED ORDER — DIAZEPAM 5 MG PO TABS: 5.0000 mg | ORAL_TABLET | Freq: Three times a day (TID) | ORAL | Status: DC | PRN

## 2013-09-24 NOTE — Telephone Encounter (Signed)
Refill called in to pharmacy

## 2013-10-25 ENCOUNTER — Other Ambulatory Visit: Payer: Self-pay | Admitting: General Practice

## 2013-11-22 ENCOUNTER — Other Ambulatory Visit: Payer: Self-pay | Admitting: Nurse Practitioner

## 2013-11-22 MED ORDER — SUMATRIPTAN SUCCINATE 100 MG PO TABS: ORAL_TABLET | ORAL | Status: DC

## 2013-12-05 ENCOUNTER — Other Ambulatory Visit: Payer: Self-pay | Admitting: General Practice

## 2013-12-10 ENCOUNTER — Other Ambulatory Visit: Payer: Self-pay | Admitting: General Practice

## 2013-12-11 ENCOUNTER — Telehealth: Payer: Self-pay | Admitting: Nurse Practitioner

## 2013-12-11 MED ORDER — VENLAFAXINE HCL ER 75 MG PO CP24: 75.0000 mg | ORAL_CAPSULE | Freq: Every evening | ORAL | Status: DC

## 2013-12-11 NOTE — Telephone Encounter (Signed)
rx sent to pharmacy- effexor xr

## 2013-12-11 NOTE — Telephone Encounter (Signed)
Not seen lately

## 2014-01-08 ENCOUNTER — Other Ambulatory Visit: Payer: Self-pay | Admitting: Nurse Practitioner

## 2014-01-08 ENCOUNTER — Other Ambulatory Visit: Payer: Self-pay | Admitting: Family Medicine

## 2014-01-08 DIAGNOSIS — G43909 Migraine, unspecified, not intractable, without status migrainosus: Secondary | ICD-10-CM

## 2014-01-09 NOTE — Telephone Encounter (Signed)
Patient NTBS for follow up and lab work  

## 2014-01-09 NOTE — Telephone Encounter (Signed)
Last seen 04/12/13 Theresa Pham

## 2014-01-09 NOTE — Telephone Encounter (Signed)
Last seen 04/12/13  Mae  If approved route to nurse to call into [facility]

## 2014-01-09 NOTE — Telephone Encounter (Signed)
Medication denied- NTBS

## 2014-01-14 ENCOUNTER — Other Ambulatory Visit: Payer: Self-pay | Admitting: Family Medicine

## 2014-01-15 NOTE — Telephone Encounter (Signed)
Called in and told pharmacy need to been seen before next xray

## 2014-01-15 NOTE — Telephone Encounter (Signed)
This is okay to refill, patient must make an appointment to be seen by someone in the practice

## 2014-01-15 NOTE — Telephone Encounter (Signed)
Patient last seen in office on 04-12-13. Rx last filled on 12-05-13 for #90. Please advise. If approved please route to Pool A so nurse can phone in to pharmacy

## 2014-01-28 ENCOUNTER — Other Ambulatory Visit: Payer: Self-pay | Admitting: Nurse Practitioner

## 2014-02-20 ENCOUNTER — Encounter: Payer: Self-pay | Admitting: Family

## 2014-02-20 ENCOUNTER — Ambulatory Visit (INDEPENDENT_AMBULATORY_CARE_PROVIDER_SITE_OTHER): Payer: 59 | Admitting: Family

## 2014-02-20 VITALS — BP 125/77 | HR 99 | Temp 99.3°F | Ht 67.0 in | Wt 138.2 lb

## 2014-02-20 DIAGNOSIS — G43009 Migraine without aura, not intractable, without status migrainosus: Secondary | ICD-10-CM

## 2014-02-20 DIAGNOSIS — E559 Vitamin D deficiency, unspecified: Secondary | ICD-10-CM | POA: Insufficient documentation

## 2014-02-20 DIAGNOSIS — E785 Hyperlipidemia, unspecified: Secondary | ICD-10-CM

## 2014-02-20 DIAGNOSIS — F411 Generalized anxiety disorder: Secondary | ICD-10-CM

## 2014-02-20 DIAGNOSIS — Z23 Encounter for immunization: Secondary | ICD-10-CM

## 2014-02-20 MED ORDER — SUMATRIPTAN SUCCINATE 100 MG PO TABS: ORAL_TABLET | ORAL | Status: DC

## 2014-02-20 MED ORDER — DIAZEPAM 5 MG PO TABS: ORAL_TABLET | ORAL | Status: DC

## 2014-02-20 MED ORDER — VENLAFAXINE HCL ER 75 MG PO CP24: 75.0000 mg | ORAL_CAPSULE | Freq: Every evening | ORAL | Status: DC

## 2014-02-20 NOTE — Progress Notes (Signed)
Subjective:    Patient ID: Theresa Pham, female    DOB: Feb 10, 1961, 53 y.o.   MRN: [id number]  Anxiety Presents for follow-up visit. Patient reports no depressed mood, excessive worry, irritability, nervous/anxious behavior, palpitations, panic, restlessness or shortness of breath. Symptoms occur rarely. The severity of symptoms is mild. The quality of sleep is good.   Her past medical history is significant for anxiety/panic attacks. There is no history of depression. Past treatments include benzodiazephines and SSRIs. The treatment provided moderate relief. Compliance with prior treatments has been good.  Hyperlipidemia This is a chronic problem. The current episode started more than 1 year ago. The problem is uncontrolled. Recent lipid tests were reviewed and are high. She has no history of diabetes or hypothyroidism. Factors aggravating her hyperlipidemia include smoking. Pertinent negatives include no leg pain or shortness of breath. She is currently on no antihyperlipidemic treatment (Pt states she does not want to be on cholesterol medicatiions). Compliance problems include medication side effects.    Pt has hx of migraines since she was a child. Pt currently taking imitrex prn and effexor daily. Pt states she has been recently waking up with migraines. Pt states she use to see a neurologists, but lost her insurance and had to stop going. Pt states she would like a referral now since she has insurance. Pt states she has tried topamax with no success. "Makes her feel paralysis".    Review of Systems  Constitutional: Negative.  Negative for irritability.  HENT: Positive for sinus pressure.   Eyes: Negative.   Respiratory: Negative.  Negative for shortness of breath.   Cardiovascular: Negative.  Negative for palpitations.  Gastrointestinal: Negative.   Endocrine: Negative.   Genitourinary: Negative.   Musculoskeletal: Positive for arthralgias.  Neurological: Negative.  Negative for  headaches.  Hematological: Negative.   Psychiatric/Behavioral: Negative.  The patient is not nervous/anxious.   All other systems reviewed and are negative.      Objective:   Physical Exam  Vitals reviewed. Constitutional: She is oriented to person, place, and time. She appears well-developed and well-nourished. No distress.  HENT:  Head: Normocephalic and atraumatic.  Right Ear: External ear normal.  Mouth/Throat: Oropharynx is clear and moist.  Eyes: Pupils are equal, round, and reactive to light.  Neck: Normal range of motion. Neck supple. No thyromegaly present.  Cardiovascular: Normal rate, regular rhythm, normal heart sounds and intact distal pulses.   No murmur heard. Pulmonary/Chest: Effort normal and breath sounds normal. No respiratory distress. She has no wheezes.  Abdominal: Soft. Bowel sounds are normal. She exhibits no distension. There is no tenderness.  Musculoskeletal: Normal range of motion. She exhibits no edema and no tenderness.  Neurological: She is alert and oriented to person, place, and time. She has normal reflexes. No cranial nerve deficit.  Skin: Skin is warm and dry.  Psychiatric: She has a normal mood and affect. Her behavior is normal. Judgment and thought content normal.    BP 125/77  Pulse 99  Temp(Src) 99.3 F (37.4 C) (Oral)  Ht '5\' 7"'  (1.702 m)  Wt 138 lb 3.2 oz (62.687 kg)  BMI 21.64 kg/m2       Assessment & Plan:  1. Generalized anxiety disorder - CMP14+EGFR - diazepam (VALIUM) 5 MG tablet; TAKE 1/2 TO 1 TABLET EVERY 8 HOURS AS NEEDED  Dispense: 90 tablet; Refill: 1 - venlafaxine XR (EFFEXOR-XR) 75 MG 24 hr capsule; Take 1 capsule (75 mg total) by mouth every evening.  Dispense: 90  capsule; Refill: 2  2. Migraine without aura and without status migrainosus, not intractable - CMP14+EGFR - SUMAtriptan (IMITREX) 100 MG tablet; May take 193m once, then repeat after two hours if needed. Not more than 2051min 24 hours.  Dispense: 30  tablet; Refill: 1 - diazepam (VALIUM) 5 MG tablet; TAKE 1/2 TO 1 TABLET EVERY 8 HOURS AS NEEDED  Dispense: 90 tablet; Refill: 1 - venlafaxine XR (EFFEXOR-XR) 75 MG 24 hr capsule; Take 1 capsule (75 mg total) by mouth every evening.  Dispense: 90 capsule; Refill: 2 - Ambulatory referral to Neurology  3. Hyperlipidemia -Pt does not want to be on medication  4. Unspecified vitamin D deficiency - Vit D  25 hydroxy (rtn osteoporosis monitoring)   Continue all meds Labs pending Health Maintenance reviewed-TDAP given today Diet and exercise encouraged RTO 6 months  ChEvelina DunFNP

## 2014-02-20 NOTE — Patient Instructions (Signed)

## 2014-02-27 ENCOUNTER — Telehealth: Payer: Self-pay | Admitting: Neurology

## 2014-02-27 ENCOUNTER — Institutional Professional Consult (permissible substitution): Payer: 59 | Admitting: Neurology

## 2014-02-27 NOTE — Telephone Encounter (Signed)
This patient did not show for a new patient appointment today. 

## 2014-03-12 ENCOUNTER — Encounter: Payer: Self-pay | Admitting: Neurology

## 2014-03-12 ENCOUNTER — Ambulatory Visit (INDEPENDENT_AMBULATORY_CARE_PROVIDER_SITE_OTHER): Payer: 59 | Admitting: Neurology

## 2014-03-12 VITALS — BP 129/80 | HR 94 | Ht 67.5 in | Wt 140.5 lb

## 2014-03-12 DIAGNOSIS — G43009 Migraine without aura, not intractable, without status migrainosus: Secondary | ICD-10-CM

## 2014-03-12 MED ORDER — VENLAFAXINE HCL ER 150 MG PO CP24: 150.0000 mg | ORAL_CAPSULE | Freq: Every day | ORAL | Status: DC

## 2014-03-12 NOTE — Patient Instructions (Signed)

## 2014-03-12 NOTE — Progress Notes (Signed)
Reason for visit: Migraine headache  Theresa Pham is a 53 y.o. female  History of present illness:  Theresa Pham is a 53 year old right-handed white female with a history of migraine headache. The patient was last seen through this office in June of 2009. In the past, the patient has been tried on Topamax and Inderal without benefit. She indicates that her headaches usually were occurring once every week, but over the last 18 months, the headaches have become more frequent, occurring 2 or 3 times a week. The patient indicates that Imitrex tablets are effective for her headache. The headaches usually begin in the occipital area, left greater than right side, and then radiate for to the retro-orbital area. The patient may have nausea and vomiting with the headache, particularly if she does not take her Imitrex. The patient has photophobia and phonophobia. The patient goes on to say that she takes on average 2-3 Goody powders a day, and she drinks 1 L of Mountain Dew every day, in addition to iced tea. The patient indicates that heat exposure, perfumes, and weather changes may activate the headache. The patient also will have headaches during the "let down period", oftentimes occurring on weekends. The patient denies any numbness or weakness with the headaches. She does have some scalp tenderness with the headache. She returns to this office for an evaluation.  Past Medical History  Diagnosis Date  . Anxiety   . Hx of migraine headaches   . Hyperlipidemia   . IBS (irritable bowel syndrome)     hx of, no problems  . History of kidney stones 1993  . Headache(784.0)     migraines, frequent  . Arthritis   . Anemia     hx of  . TMJ (dislocation of temporomandibular joint)     hx of    Past Surgical History  Procedure Laterality Date  . Lasik  2006, 2012  . Vaginal hysterectomy  2002    has her right ovary  . Nasal septum surgery  1984  . Cholecystectomy N/A 06/22/2013    Procedure:  LAPAROSCOPIC CHOLECYSTECTOMY;  Surgeon: Ralene Ok, MD;  Location: [facility];  Service: General;  Laterality: N/A;    Family History  Problem Relation Age of Onset  . Hypertension Mother   . Uterine cancer Mother   . Cancer Mother     uterine  . Migraines Mother   . Heart attack Father 23  . Colon cancer Neg Hx   . Breast cancer Maternal Aunt   . Cancer Maternal Aunt     breast  . Migraines Daughter     Social history:  reports that she quit smoking about 35 years ago. Her smoking use included Cigarettes. She smoked 0.00 packs per day for 7 years. She has never used smokeless tobacco. She reports that she drinks alcohol. She reports that she does not use illicit drugs.  Medications:  Current Outpatient Prescriptions on File Prior to Visit  Medication Sig Dispense Refill  . Aspirin-Acetaminophen-Caffeine (GOODY HEADACHE PO) Take 1 packet by mouth every 6 (six) hours as needed (HEADACHES).       . cholecalciferol (VITAMIN D) 1000 UNITS tablet Take 1,000 Units by mouth daily.      . diazepam (VALIUM) 5 MG tablet TAKE 1/2 TO 1 TABLET EVERY 8 HOURS AS NEEDED  90 tablet  1  . diphenhydrAMINE (BENADRYL) 25 MG tablet Take 25 mg by mouth every 6 (six) hours as needed for allergies or sleep.      Marland Kitchen  Ginger, Zingiber officinalis, (GINGER ROOT PO) Take 1 capsule by mouth daily. As needed for stomach upset      . KRILL OIL PO Take 1 capsule by mouth daily.       . milk thistle 175 MG tablet Take 175 mg by mouth daily.      . pseudoephedrine (SUDAFED) 30 MG tablet Take 60 mg by mouth every 4 (four) hours as needed for congestion.      . SUMAtriptan (IMITREX) 100 MG tablet May take 100mg  once, then repeat after two hours if needed. Not more than 200mg  in 24 hours.  30 tablet  1  . vitamin B-12 (CYANOCOBALAMIN) 1000 MCG tablet Take 1,000 mcg by mouth daily.       No current facility-administered medications on file prior to visit.      Allergies  Allergen Reactions  . Ampicillin Rash  .  Azithromycin Diarrhea  . Penicillins Rash  . Other     CORNSTARCH -MIGRAINES  . Codeine Nausea Only    ROS:  Out of a complete 14 system review of symptoms, the patient complains only of the following symptoms, and all other reviewed systems are negative.  Fatigue Itching Achy muscles Allergies Headache Anxiety  Blood pressure 129/80, pulse 94, height 5' 7.5" (1.715 m), weight 140 lb 8 oz (63.73 kg).  Physical Exam  General: The patient is alert and cooperative at the time of the examination.  Eyes: Pupils are equal, round, and reactive to light. Discs are flat bilaterally.  Neck: The neck is supple, no carotid bruits are noted.  Respiratory: The respiratory examination is clear.  Cardiovascular: The cardiovascular examination reveals a regular rate and rhythm, no obvious murmurs or rubs are noted.  Neuromuscular: Range of movement of the cervical spine is full. No crepitus is noted in the temporomandibular joints.  Skin: Extremities are without significant edema.  Neurologic Exam  Mental status: The patient is alert and oriented x 3 at the time of the examination. The patient has apparent normal recent and remote memory, with an apparently normal attention span and concentration ability.  Cranial nerves: Facial symmetry is present. There is good sensation of the face to pinprick and soft touch bilaterally. The strength of the facial muscles and the muscles to head turning and shoulder shrug are normal bilaterally. Speech is well enunciated, no aphasia or dysarthria is noted. Extraocular movements are full. Visual fields are full. The tongue is midline, and the patient has symmetric elevation of the soft palate. No obvious hearing deficits are noted.  Motor: The motor testing reveals 5 over 5 strength of all 4 extremities. Good symmetric motor tone is noted throughout.  Sensory: Sensory testing is intact to pinprick, soft touch, vibration sensation, and position sense on  all 4 extremities. No evidence of extinction is noted.  Coordination: Cerebellar testing reveals good finger-nose-finger and heel-to-shin bilaterally.  Gait and station: Gait is normal. Tandem gait is normal. Romberg is negative. No drift is seen.  Reflexes: Deep tendon reflexes are symmetric and normal bilaterally, with exception that back of her reflexes are slightly depressed. Toes are downgoing bilaterally.   Assessment/Plan:  One. Migraine headache  2. Caffeine overuse  The patient is on huge doses of caffeine daily. The patient is on 2 or 3 Goody powders daily, and drinks 1 L of [employer] daily. The patient needs to taper off of the caffeine intake within the next 4-6 weeks. She can substitute ibuprofen for the Goody powders. The patient will be  increased on the Effexor taking 150 mg daily. She can continue to take her Imitrex if needed for the headache. She will followup in about 3 months. The headaches will increase as she comes off of the caffeine, transiently.  Jill Alexanders MD 03/12/2014 8:30 PM  [facility] [address] [place], [place] [address]  Phone [phone] Fax [phone]

## 2014-03-28 ENCOUNTER — Other Ambulatory Visit: Payer: Self-pay | Admitting: Family Medicine

## 2014-03-29 ENCOUNTER — Telehealth: Payer: Self-pay | Admitting: *Deleted

## 2014-03-29 DIAGNOSIS — M25511 Pain in right shoulder: Secondary | ICD-10-CM

## 2014-03-29 DIAGNOSIS — M25512 Pain in left shoulder: Principal | ICD-10-CM

## 2014-03-29 NOTE — Telephone Encounter (Signed)
Patient last seen in office on 02-20-14. Rx last filled on 02-20-14 for #90. Please advise. If approved please route to Pool A so nurse can phone in to pharmacy

## 2014-03-29 NOTE — Telephone Encounter (Signed)
rx called into pharmacy

## 2014-03-29 NOTE — Telephone Encounter (Signed)
Per [facility] Do this referral for pt

## 2014-05-24 ENCOUNTER — Other Ambulatory Visit: Payer: Self-pay

## 2014-05-31 ENCOUNTER — Other Ambulatory Visit: Payer: Self-pay | Admitting: Nurse Practitioner

## 2014-06-03 ENCOUNTER — Telehealth: Payer: Self-pay | Admitting: *Deleted

## 2014-06-03 NOTE — Telephone Encounter (Signed)
Scripts called in

## 2014-06-03 NOTE — Telephone Encounter (Signed)
Please call in valium with 1 refills

## 2014-06-03 NOTE — Telephone Encounter (Signed)
Last seen 02/20/14 Theresa Pham  If approved route to nurse to call into [facility]

## 2014-06-13 ENCOUNTER — Ambulatory Visit (INDEPENDENT_AMBULATORY_CARE_PROVIDER_SITE_OTHER): Payer: 59 | Admitting: Adult Health

## 2014-06-13 ENCOUNTER — Encounter: Payer: Self-pay | Admitting: Adult Health

## 2014-06-13 VITALS — BP 126/79 | HR 109 | Ht 68.0 in | Wt 143.0 lb

## 2014-06-13 DIAGNOSIS — G43009 Migraine without aura, not intractable, without status migrainosus: Secondary | ICD-10-CM

## 2014-06-13 MED ORDER — SUMATRIPTAN SUCCINATE 100 MG PO TABS: ORAL_TABLET | ORAL | Status: DC

## 2014-06-13 NOTE — Progress Notes (Signed)
PATIENT: Theresa Pham DOB: 1961-08-05  REASON FOR VISIT: follow up HISTORY FROM: patient  HISTORY OF PRESENT ILLNESS: Theresa Pham is a 53 year old female with a history of migraine headache. She returns today for follow-up. She is currently taking Effexor and tolerating it well. She reports that her headaches have improved. She has approximately 2 severe headaches per month but will have approximately 10 dull headaches. She can take Imitrex and that resolves her headaches.  Her headache is triggered by the weather. In the last two weeks she has had more headaches because of the rain.  Her headaches are usually located in the left retro orbital area. She is + nausea and  vomiting without medication. + for photophobia and phonophobia. Since the last visit the patient has cut down on the  caffeine. She now only drinks 24 oz of mountain dew daily whereas before she was drinking 2 Liters.   HISTORY 03/12/14 Theresa Pham): 53 year old right-handed white female with a history of migraine headache. The patient was last seen through this office in June of 2009. In the past, the patient has been tried on Topamax and Inderal without benefit. She indicates that her headaches usually were occurring once every week, but over the last 18 months, the headaches have become more frequent, occurring 2 or 3 times a week. The patient indicates that Imitrex tablets are effective for her headache. The headaches usually begin in the occipital area, left greater than right side, and then radiate for to the retro-orbital area. The patient may have nausea and vomiting with the headache, particularly if she does not take her Imitrex. The patient has photophobia and phonophobia. The patient goes on to say that she takes on average 2-3 Goody powders a day, and she drinks 1 L of Mountain Dew every day, in addition to iced tea. The patient indicates that heat exposure, perfumes, and weather changes may activate the headache. The patient  also will have headaches during the "let down period", oftentimes occurring on weekends. The patient denies any numbness or weakness with the headaches. She does have some scalp tenderness with the headache. She returns to this office for an evaluation.  REVIEW OF SYSTEMS: Full 14 system review of systems performed and notable only for:  Constitutional: N/A  Eyes: Eye itching Ear/Nose/Throat: runny nose Skin: itching Cardiovascular: N/A  Respiratory: cough Gastrointestinal: N/A  Genitourinary: N/A Hematology/Lymphatic: N/A  Endocrine: N/A Musculoskeletal:joint pain, back pain, aching muscles, muscle cramps, neck pain, neck stiffness  Allergy/Immunology: food allergies Neurological: headache Psychiatric: N/A Sleep: frequent waking   ALLERGIES: Allergies  Allergen Reactions  . Ampicillin Rash  . Azithromycin Diarrhea  . Penicillins Rash  . Other     CORNSTARCH -MIGRAINES  . Codeine Nausea Only    HOME MEDICATIONS: Outpatient Prescriptions Prior to Visit  Medication Sig Dispense Refill  . Aspirin-Acetaminophen-Caffeine (GOODY HEADACHE PO) Take 1 packet by mouth every 6 (six) hours as needed (HEADACHES).     . cholecalciferol (VITAMIN D) 1000 UNITS tablet Take 1,000 Units by mouth daily.    . diazepam (VALIUM) 5 MG tablet TAKE 1/2 TO 1 TABLET EVERY 8 HOURS AS NEEDED 90 tablet 0  . diphenhydrAMINE (BENADRYL) 25 MG tablet Take 25 mg by mouth every 6 (six) hours as needed for allergies or sleep.    . Ginger, Zingiber officinalis, (GINGER ROOT PO) Take 1 capsule by mouth daily. As needed for stomach upset    . milk thistle 175 MG tablet Take 175 mg by  mouth daily.    . pseudoephedrine (SUDAFED) 30 MG tablet Take 60 mg by mouth every 4 (four) hours as needed for congestion.    . SUMAtriptan (IMITREX) 100 MG tablet May take 100mg  once, then repeat after two hours if needed. Not more than 200mg  in 24 hours. 30 tablet 1  . venlafaxine XR (EFFEXOR XR) 150 MG 24 hr capsule Take 1 capsule  (150 mg total) by mouth daily with breakfast. 30 capsule 5  . vitamin B-12 (CYANOCOBALAMIN) 1000 MCG tablet Take 1,000 mcg by mouth daily.    Marland Kitchen KRILL OIL PO Take 1 capsule by mouth daily.      No facility-administered medications prior to visit.    PAST MEDICAL HISTORY: Past Medical History  Diagnosis Date  . Anxiety   . Hx of migraine headaches   . Hyperlipidemia   . IBS (irritable bowel syndrome)     hx of, no problems  . History of kidney stones 1993  . Headache(784.0)     migraines, frequent  . Arthritis   . Anemia     hx of  . TMJ (dislocation of temporomandibular joint)     hx of    PAST SURGICAL HISTORY: Past Surgical History  Procedure Laterality Date  . Lasik  2006, 2012  . Vaginal hysterectomy  2002    has her right ovary  . Nasal septum surgery  1984  . Cholecystectomy N/A 06/22/2013    Procedure: LAPAROSCOPIC CHOLECYSTECTOMY;  Surgeon: Ralene Ok, MD;  Location: [facility];  Service: General;  Laterality: N/A;    FAMILY HISTORY: Family History  Problem Relation Age of Onset  . Hypertension Mother   . Uterine cancer Mother   . Cancer Mother     uterine  . Migraines Mother   . Heart attack Father 58  . Colon cancer Neg Hx   . Breast cancer Maternal Aunt   . Cancer Maternal Aunt     breast  . Migraines Daughter     SOCIAL HISTORY: History   Social History  . Marital Status: Married    Spouse Name: Mikki Santee    Number of Children: 1  . Years of Education: college3   Occupational History  . [employer].   [employer] CLERK     Social History Main Topics  . Smoking status: Former Smoker -- 7 years    Types: Cigarettes    Quit date: 08/09/1978  . Smokeless tobacco: Never Used  . Alcohol Use: Yes     Comment: rare  . Drug Use: No  . Sexual Activity: Not on file   Other Topics Concern  . Not on file   Social History Narrative   Patient lives at home husband Mikki Santee.    Patient has 1 child.    Patient has a college education.     Patient is right handed.       PHYSICAL EXAM  Filed Vitals:   06/13/14 1415  BP: 126/79  Pulse: 109  Height: 5\' 8"  (1.727 m)  Weight: 143 lb (64.864 kg)   Body mass index is 21.75 kg/(m^2).  Generalized: Well developed, in no acute distress   Neurological examination  Mentation: Alert oriented to time, place, history taking. Follows all commands speech and language fluent Cranial nerve II-XII: Pupils were equal round reactive to light. Extraocular movements were full, visual field were full on confrontational test. Facial sensation and strength were normal.  Uvula tongue midline. Head turning and shoulder shrug  were normal and symmetric.  Motor: The motor testing reveals 5 over 5 strength of all 4 extremities. Good symmetric motor tone is noted throughout.  Sensory: Sensory testing is intact to soft touch on all 4 extremities. No evidence of extinction is noted.  Coordination: Cerebellar testing reveals good finger-nose-finger and heel-to-shin bilaterally.  Gait and station: Gait is normal. Tandem gait is normal. Romberg is negative. No drift is seen.  Reflexes: Deep tendon reflexes are symmetric and normal bilaterally.    DIAGNOSTIC DATA (LABS, IMAGING, TESTING) - I reviewed patient records, labs, notes, testing and imaging myself where available.  Lab Results  Component Value Date   WBC 10.5 06/21/2013   HGB 12.9 06/21/2013   HCT 39.2 06/21/2013   MCV 96.1 06/21/2013   PLT 345 06/21/2013      Component Value Date/Time   NA 143 04/12/2013 1023   NA 140 11/16/2012 1019   K 4.1 04/12/2013 1023   CL 101 04/12/2013 1023   CO2 25 04/12/2013 1023   GLUCOSE 82 04/12/2013 1023   GLUCOSE 60* 11/16/2012 1019   BUN 7 04/12/2013 1023   BUN 11 11/16/2012 1019   CREATININE 0.68 04/12/2013 1023   CREATININE 0.84 11/16/2012 1019   CALCIUM 10.1 04/12/2013 1023   PROT 7.0 04/12/2013 1023   PROT 7.2 11/16/2012 1019   ALBUMIN 4.6 11/16/2012 1019   AST 17 04/12/2013 1023   ALT 9  04/12/2013 1023   ALKPHOS 90 04/12/2013 1023   BILITOT 0.3 04/12/2013 1023   GFRNONAA 100 04/12/2013 1023   GFRNONAA 80 11/16/2012 1019   GFRAA 115 04/12/2013 1023   GFRAA >89 11/16/2012 1019   Lab Results  Component Value Date   CHOL 226* 04/12/2013   HDL 41 04/12/2013   LDLCALC 158* 04/12/2013   TRIG 134 04/12/2013    Lab Results  Component Value Date   TSH 0.898 11/16/2012      ASSESSMENT AND PLAN 53 y.o. year old female  has a past medical history of Anxiety; migraine headaches; Hyperlipidemia; IBS (irritable bowel syndrome); History of kidney stones (1993); Headache(784.0); Arthritis; Anemia; and TMJ (dislocation of temporomandibular joint). here with:  1. Migraines  Patient's migraines have improved with the increase in the Effexor. The patient has had more headaches in the last two weeks due to the weather. She will continue the Effexor and Imitrex. I will refill the Imitrex today. If patients headache frequency increases then she should let us know. Otherwise she will followup in 6 months or sooner if needed   Ward Givens, MSN, NP-C 06/13/2014, 2:23 PM [facility] [address] [place], [place] [address] [phone]  Note: This document was prepared with digital dictation and possible smart phrase technology. Any transcriptional errors that result from this process are unintentional.

## 2014-06-13 NOTE — Progress Notes (Signed)
I have read the note, and I agree with the clinical assessment and plan.  Theresa Pham   

## 2014-06-13 NOTE — Patient Instructions (Signed)

## 2014-07-31 ENCOUNTER — Other Ambulatory Visit: Payer: Self-pay | Admitting: Nurse Practitioner

## 2014-07-31 NOTE — Telephone Encounter (Signed)
Please advise on refill.  Last saw C Hawks on 02/20/14, has follow up 08/23/14.

## 2014-08-01 NOTE — Telephone Encounter (Signed)
Please call in valium with 0 refills no more refills without being seen

## 2014-08-01 NOTE — Telephone Encounter (Signed)
Pt aware.

## 2014-08-23 ENCOUNTER — Ambulatory Visit (INDEPENDENT_AMBULATORY_CARE_PROVIDER_SITE_OTHER): Payer: 59 | Admitting: Family

## 2014-08-23 ENCOUNTER — Encounter: Payer: Self-pay | Admitting: Family

## 2014-08-23 VITALS — BP 128/81 | HR 99 | Temp 96.7°F | Ht 68.0 in | Wt 138.8 lb

## 2014-08-23 DIAGNOSIS — E538 Deficiency of other specified B group vitamins: Secondary | ICD-10-CM

## 2014-08-23 DIAGNOSIS — E559 Vitamin D deficiency, unspecified: Secondary | ICD-10-CM

## 2014-08-23 DIAGNOSIS — E785 Hyperlipidemia, unspecified: Secondary | ICD-10-CM

## 2014-08-23 DIAGNOSIS — G43009 Migraine without aura, not intractable, without status migrainosus: Secondary | ICD-10-CM

## 2014-08-23 DIAGNOSIS — F411 Generalized anxiety disorder: Secondary | ICD-10-CM

## 2014-08-23 MED ORDER — DIAZEPAM 5 MG PO TABS: ORAL_TABLET | ORAL | Status: DC

## 2014-08-23 MED ORDER — SUMATRIPTAN SUCCINATE 100 MG PO TABS: ORAL_TABLET | ORAL | Status: DC

## 2014-08-23 NOTE — Patient Instructions (Signed)

## 2014-08-23 NOTE — Progress Notes (Addendum)
Subjective:    Patient ID: Theresa Pham, female    DOB: 1961-02-14, 54 y.o.   MRN: [id number]  Hyperlipidemia This is a chronic problem. The current episode started more than 1 year ago. The problem is uncontrolled. Recent lipid tests were reviewed and are high. She has no history of diabetes or hypothyroidism. Factors aggravating her hyperlipidemia include smoking. Pertinent negatives include no leg pain or shortness of breath. Current antihyperlipidemic treatment includes diet change (Pt states she does not want to be on cholesterol medicatiions). The current treatment provides no improvement of lipids. Compliance problems include medication side effects.  Risk factors for coronary artery disease include dyslipidemia, post-menopausal and family history.  Anxiety Presents for follow-up visit. The problem has been gradually improving. Patient reports no depressed mood, excessive worry, irritability, nervous/anxious behavior, palpitations, panic, restlessness or shortness of breath. Symptoms occur rarely. The severity of symptoms is mild. The quality of sleep is good.   Her past medical history is significant for anxiety/panic attacks. There is no history of depression. Past treatments include benzodiazephines and SSRIs. The treatment provided moderate relief. Compliance with prior treatments has been good.   *Pt has chronic migraines and has neurologists- Dr Floyde Parkins- who manages that.   Review of Systems  Constitutional: Negative.  Negative for irritability.  HENT: Negative.   Eyes: Negative.   Respiratory: Negative.  Negative for shortness of breath.   Cardiovascular: Negative.  Negative for palpitations.  Gastrointestinal: Negative.   Endocrine: Negative.   Genitourinary: Negative.   Musculoskeletal: Negative.   Neurological: Negative.  Negative for headaches.  Hematological: Negative.   Psychiatric/Behavioral: Negative.  The patient is not nervous/anxious.   All other systems  reviewed and are negative.      Objective:   Physical Exam  Constitutional: She is oriented to person, place, and time. She appears well-developed and well-nourished. No distress.  HENT:  Head: Normocephalic and atraumatic.  Right Ear: External ear normal.  Left Ear: External ear normal.  Nose: Nose normal.  Mouth/Throat: Oropharynx is clear and moist.  Eyes: Pupils are equal, round, and reactive to light.  Neck: Normal range of motion. Neck supple. No thyromegaly present.  Cardiovascular: Normal rate, regular rhythm, normal heart sounds and intact distal pulses.   No murmur heard. Pulmonary/Chest: Effort normal and breath sounds normal. No respiratory distress. She has no wheezes.  Abdominal: Soft. Bowel sounds are normal. She exhibits no distension. There is no tenderness.  Musculoskeletal: Normal range of motion. She exhibits no edema or tenderness.  Neurological: She is alert and oriented to person, place, and time. She has normal reflexes. No cranial nerve deficit.  Skin: Skin is warm and dry.  Psychiatric: She has a normal mood and affect. Her behavior is normal. Judgment and thought content normal.  Vitals reviewed.     BP 128/81 mmHg  Pulse 99  Temp(Src) 96.7 F (35.9 C) (Oral)  Ht '5\' 8"'  (1.727 m)  Wt 138 lb 12.8 oz (62.959 kg)  BMI 21.11 kg/m2     Assessment & Plan:  1. Vitamin D deficiency - CMP14+EGFR - Vit D  25 hydroxy (rtn osteoporosis monitoring)  2. Hyperlipidemia -Pt does not want to take cholesterol medication  CMP14+EGFR - Lipid panel  3. Generalized anxiety disorder - CMP14+EGFR - diazepam (VALIUM) 5 MG tablet; TAKE 1/2 TO 1 TABLET EVERY 8 HOURS AS NEEDED  Dispense: 90 tablet; Refill: 0  4. Migraine without aura and without status migrainosus, not intractable - CMP14+EGFR - SUMAtriptan (IMITREX) 100  MG tablet; May take 182m once, then repeat after two hours if needed. Not more than 2052min 24 hours.  Dispense: 10 tablet; Refill: 4  5.  Vitamin B 12 deficiency - Vitamin B12   Continue all meds Labs pending Health Maintenance reviewed Diet and exercise encouraged RTO 1 year  ChEvelina DunFNP

## 2014-08-24 LAB — CMP14+EGFR
A/G RATIO: 2.2 (ref 1.1–2.5)
ALK PHOS: 110 IU/L (ref 39–117)
ALT: 15 IU/L (ref 0–32)
AST: 19 IU/L (ref 0–40)
Albumin: 4.6 g/dL (ref 3.5–5.5)
BUN/Creatinine Ratio: 11 (ref 9–23)
BUN: 9 mg/dL (ref 6–24)
CHLORIDE: 100 mmol/L (ref 97–108)
CO2: 24 mmol/L (ref 18–29)
Calcium: 10.3 mg/dL — ABNORMAL HIGH (ref 8.7–10.2)
Creatinine, Ser: 0.81 mg/dL (ref 0.57–1.00)
GFR calc Af Amer: 95 mL/min/{1.73_m2} (ref >59)
GFR, EST NON AFRICAN AMERICAN: 83 mL/min/{1.73_m2} (ref >59)
GLUCOSE: 72 mg/dL (ref 65–99)
Globulin, Total: 2.1 g/dL (ref 1.5–4.5)
Potassium: 4.3 mmol/L (ref 3.5–5.2)
Sodium: 141 mmol/L (ref 134–144)
TOTAL PROTEIN: 6.7 g/dL (ref 6.0–8.5)
Total Bilirubin: 0.2 mg/dL (ref 0.0–1.2)

## 2014-08-24 LAB — LIPID PANEL
CHOL/HDL RATIO: 5.7 ratio — AB (ref 0.0–4.4)
CHOLESTEROL TOTAL: 233 mg/dL — AB (ref 100–199)
HDL: 41 mg/dL (ref >39)
LDL Calculated: 161 mg/dL — ABNORMAL HIGH (ref 0–99)
TRIGLYCERIDES: 153 mg/dL — AB (ref 0–149)
VLDL Cholesterol Cal: 31 mg/dL (ref 5–40)

## 2014-08-24 LAB — VITAMIN B12: Vitamin B-12: 719 pg/mL (ref 211–946)

## 2014-08-24 LAB — VITAMIN D 25 HYDROXY (VIT D DEFICIENCY, FRACTURES): Vit D, 25-Hydroxy: 39.2 ng/mL (ref 30.0–100.0)

## 2014-08-25 ENCOUNTER — Other Ambulatory Visit: Payer: Self-pay | Admitting: Family

## 2014-08-26 ENCOUNTER — Telehealth: Payer: Self-pay | Admitting: Family

## 2014-08-26 NOTE — Telephone Encounter (Signed)
-----   Message from Sharion Balloon, [place] sent at 08/25/2014  5:10 PM EST ----- Kidney and liver function stable Vit D levels & B12 levels WNL Cholesterol and Triglycerides levels high- PT needs to be on fish oil....would she be willing to take something other than a statin?

## 2014-08-26 NOTE — Progress Notes (Signed)
Quick Note:  Pt aware of results and will start OTC fish oil ______

## 2014-08-29 NOTE — Telephone Encounter (Signed)
Patient aware.

## 2014-10-02 ENCOUNTER — Other Ambulatory Visit: Payer: Self-pay | Admitting: Nurse Practitioner

## 2014-10-03 NOTE — Telephone Encounter (Signed)
Last seen and filled on 08/23/14, Route to nurse to call into [facility]

## 2014-10-04 NOTE — Telephone Encounter (Signed)
Refill called to pharmacy.

## 2014-10-04 NOTE — Telephone Encounter (Signed)
Prescription sent to pharmacy.

## 2014-10-08 ENCOUNTER — Other Ambulatory Visit: Payer: Self-pay | Admitting: Neurology

## 2014-10-08 MED ORDER — VENLAFAXINE HCL ER 150 MG PO CP24: 150.0000 mg | ORAL_CAPSULE | Freq: Every day | ORAL | Status: DC

## 2014-11-27 ENCOUNTER — Other Ambulatory Visit: Payer: Self-pay | Admitting: Family

## 2014-11-28 NOTE — Telephone Encounter (Signed)
Last seen 08/13/14 Theresa Pham  If approved route to nurse to call into [facility]

## 2014-11-29 NOTE — Telephone Encounter (Signed)
Refill called to pharmacy.

## 2014-12-26 ENCOUNTER — Other Ambulatory Visit: Payer: Self-pay | Admitting: Family

## 2014-12-26 ENCOUNTER — Encounter: Payer: Self-pay | Admitting: Family Medicine

## 2014-12-26 ENCOUNTER — Ambulatory Visit (INDEPENDENT_AMBULATORY_CARE_PROVIDER_SITE_OTHER): Payer: 59 | Admitting: Family Medicine

## 2014-12-26 ENCOUNTER — Other Ambulatory Visit: Payer: Self-pay | Admitting: Family Medicine

## 2014-12-26 VITALS — BP 110/67 | HR 87 | Temp 97.6°F | Ht 68.0 in | Wt 138.0 lb

## 2014-12-26 DIAGNOSIS — R05 Cough: Secondary | ICD-10-CM | POA: Diagnosis not present

## 2014-12-26 DIAGNOSIS — R6889 Other general symptoms and signs: Secondary | ICD-10-CM

## 2014-12-26 DIAGNOSIS — J019 Acute sinusitis, unspecified: Secondary | ICD-10-CM

## 2014-12-26 DIAGNOSIS — R059 Cough, unspecified: Secondary | ICD-10-CM

## 2014-12-26 LAB — POCT INFLUENZA A/B
INFLUENZA B, POC: NEGATIVE
Influenza A, POC: NEGATIVE

## 2014-12-26 MED ORDER — FLUTICASONE PROPIONATE 50 MCG/ACT NA SUSP: NASAL | Status: DC

## 2014-12-26 MED ORDER — DOXYCYCLINE HYCLATE 100 MG PO TABS: 100.0000 mg | ORAL_TABLET | Freq: Two times a day (BID) | ORAL | Status: DC

## 2014-12-26 NOTE — Patient Instructions (Signed)
Drink plenty of fluids Take Tylenol or ibuprofen as needed for aches pains and fever Use saline nose spray Use Mucinex maximum strength, plain, blue and white in color, and take one twice daily for cough and congestion Take antibiotic as directed until completed

## 2014-12-26 NOTE — Telephone Encounter (Signed)
Last seen 08/23/14  [facility]

## 2014-12-26 NOTE — Telephone Encounter (Signed)
Last seen 08/23/14  Theresa Pham  If approved route to nurse to call into [facility]

## 2014-12-26 NOTE — Progress Notes (Signed)
Subjective:    Patient ID: Theresa Pham, female    DOB: [birth year], 54 y.o.   MRN: [id number]  HPI Pt here for flu like symptoms which includes slight cough, sinus drainage and body aches. This just started today before lunch.      Patient Active Problem List   Diagnosis Date Noted  . Vitamin D deficiency 02/20/2014  . Generalized anxiety disorder 12/07/2012  . Migraine 12/07/2012  . Hyperlipidemia 12/07/2012   Outpatient Encounter Prescriptions as of 12/26/2014  Medication Sig  . Aspirin-Acetaminophen-Caffeine (GOODY HEADACHE PO) Take 1 packet by mouth every 6 (six) hours as needed (HEADACHES).   . cholecalciferol (VITAMIN D) 1000 UNITS tablet Take 1,000 Units by mouth daily.  . diazepam (VALIUM) 5 MG tablet TAKE 1/2 TO 1 TABLET EVERY 8 HOURS AS NEEDED  . diphenhydrAMINE (BENADRYL) 25 MG tablet Take 25 mg by mouth every 6 (six) hours as needed for allergies or sleep.  . milk thistle 175 MG tablet Take 175 mg by mouth daily.  Marland Kitchen POTASSIUM PO Take by mouth.  . SUMAtriptan (IMITREX) 100 MG tablet May take 100mg  once, then repeat after two hours if needed. Not more than 200mg  in 24 hours.  Marland Kitchen venlafaxine XR (EFFEXOR XR) 150 MG 24 hr capsule Take 1 capsule (150 mg total) by mouth daily with breakfast.  . vitamin B-12 (CYANOCOBALAMIN) 1000 MCG tablet Take 1,000 mcg by mouth daily.  . [DISCONTINUED] Ginger, Zingiber officinalis, (GINGER ROOT PO) Take 1 capsule by mouth daily. As needed for stomach upset  . pseudoephedrine (SUDAFED) 30 MG tablet Take 60 mg by mouth every 4 (four) hours as needed for congestion.   No facility-administered encounter medications on file as of 12/26/2014.     Review of Systems  Constitutional: Positive for chills.  HENT: Positive for congestion, postnasal drip and sinus pressure.   Eyes: Negative.   Respiratory: Positive for cough.   Cardiovascular: Negative.   Gastrointestinal: Negative.   Endocrine: Negative.   Genitourinary: Negative.     Musculoskeletal: Positive for myalgias.  Skin: Negative.   Allergic/Immunologic: Negative.   Neurological: Negative.   Hematological: Negative.   Psychiatric/Behavioral: Negative.        Objective:   Physical Exam  Constitutional: She appears well-developed and well-nourished. No distress.  HENT:  Head: Normocephalic and atraumatic.  Right Ear: External ear normal.  Left Ear: External ear normal.  There is nasal congestion bilaterally left greater than right and drainage down the posterior throat. Both TMs were normal.  Eyes: Conjunctivae and EOM are normal. Pupils are equal, round, and reactive to light. Right eye exhibits no discharge. Left eye exhibits no discharge. No scleral icterus.  Neck: Normal range of motion. Neck supple. No thyromegaly present.  Cardiovascular: Normal rate, regular rhythm and normal heart sounds.   No murmur heard. Pulmonary/Chest: Effort normal and breath sounds normal. She has no wheezes. She has no rales.  Dry cough  Musculoskeletal: Normal range of motion.  Lymphadenopathy:    She has no cervical adenopathy.  Neurological: She is alert.  Skin: Skin is warm and dry. No rash noted.  Psychiatric: She has a normal mood and affect. Her behavior is normal. Judgment and thought content normal.  Vitals reviewed.  BP 110/67 mmHg  Pulse 87  Temp(Src) 97.6 F (36.4 C) (Oral)  Ht 5\' 8"  (1.727 m)  Wt 138 lb (62.596 kg)  BMI 20.99 kg/m2        Assessment & Plan:  1. Flu-like symptoms -The rapid flu  test was negative. -Take Tylenol or ibuprofen as needed for aches pains and fever - POCT Influenza A/B  2. Acute rhinosinusitis -Take antibiotic twice daily with food until completed - doxycycline (VIBRA-TABS) 100 MG tablet; Take 1 tablet (100 mg total) by mouth 2 (two) times daily.  Dispense: 28 tablet; Refill: 0 - fluticasone (FLONASE) 50 MCG/ACT nasal spray; One to 2 sprays each nostril at bedtime  Dispense: 16 g; Refill: 6  3. Cough -Use  Mucinex maximum strength 1 twice daily with a large glass of water for cough and congestion  Patient Instructions  Drink plenty of fluids Take Tylenol or ibuprofen as needed for aches pains and fever Use saline nose spray Use Mucinex maximum strength, plain, blue and white in color, and take one twice daily for cough and congestion Take antibiotic as directed until completed   Arrie Senate MD

## 2014-12-27 NOTE — Telephone Encounter (Signed)
Refill called to pharmacy.

## 2015-01-22 ENCOUNTER — Telehealth: Payer: Self-pay

## 2015-01-22 NOTE — Telephone Encounter (Signed)
x

## 2015-01-22 NOTE — Telephone Encounter (Signed)
Dr Crista Elliot office  [facility] called that they need a [facility] navigate referral for patient for upcoming visit  DOne  #[id number] 01/22/15 to 07/24/15 6 visits  DAB

## 2015-01-27 ENCOUNTER — Other Ambulatory Visit: Payer: Self-pay | Admitting: Family

## 2015-01-27 NOTE — Telephone Encounter (Signed)
Last seen 12/26/14 [facility]  If approved route to nurse to call into [facility]

## 2015-01-27 NOTE — Telephone Encounter (Signed)
[name] regular pt

## 2015-01-28 ENCOUNTER — Encounter: Payer: Self-pay | Admitting: Adult Health

## 2015-01-28 ENCOUNTER — Ambulatory Visit (INDEPENDENT_AMBULATORY_CARE_PROVIDER_SITE_OTHER): Payer: 59 | Admitting: Adult Health

## 2015-01-28 VITALS — BP 113/77 | HR 82 | Ht 68.0 in | Wt 135.0 lb

## 2015-01-28 DIAGNOSIS — G43009 Migraine without aura, not intractable, without status migrainosus: Secondary | ICD-10-CM | POA: Diagnosis not present

## 2015-01-28 NOTE — Patient Instructions (Signed)
Continue Effexor.  If your headaches do not improve let us know.   Try to wean off the goody powder.

## 2015-01-28 NOTE — Progress Notes (Signed)
I have read the note, and I agree with the clinical assessment and plan.  Zykiria Bruening KEITH   

## 2015-01-28 NOTE — Telephone Encounter (Signed)
rx called into [facility]  [place] per [employer]

## 2015-01-28 NOTE — Progress Notes (Signed)
PATIENT: Theresa Pham DOB: 1960/09/04  REASON FOR VISIT: follow up-migraine HISTORY FROM: patient  HISTORY OF PRESENT ILLNESS: Theresa Pham is a 54 year old female with a history of migraine headaches. She returns today for follow-up. She is currently taking Effexor and tolerating it well. She reports that in the month of May she had no headaches. However in June she's had 5 headaches. She states that her headaches are usually unilateral can occur on the right or left side. She does have nausea and vomiting as well as photophobia and phonophobia. She also states that smells bother her. The patient also states that she may wake up and be hot and sweating she states this is usually followed by nausea and vomiting and then a migraine will occur. The patient also takes Gabriel Earing powders 3 times a day. She takes this for her muscle aches. She states that she has decreased her usage- she used to take 6 Goody powders a day. She returns today for follow-up.  HISTORY 06/13/14: Theresa Pham is a 54 year old female with a history of migraine headache. She returns today for follow-up. She is currently taking Effexor and tolerating it well. She reports that her headaches have improved. She has approximately 2 severe headaches per month but will have approximately 10 dull headaches. She can take Imitrex and that resolves her headaches. Her headache is triggered by the weather. In the last two weeks she has had more headaches because of the rain. Her headaches are usually located in the left retro orbital area. She is + nausea and vomiting without medication. + for photophobia and phonophobia. Since the last visit the patient has cut down on the caffeine. She now only drinks 24 oz of mountain dew daily whereas before she was drinking 2 Liters.   HISTORY 03/12/14 Theresa Pham): 54 year old right-handed white female with a history of migraine headache. The patient was last seen through this office in June of 2009. In the  past, the patient has been tried on Topamax and Inderal without benefit. She indicates that her headaches usually were occurring once every week, but over the last 18 months, the headaches have become more frequent, occurring 2 or 3 times a week. The patient indicates that Imitrex tablets are effective for her headache. The headaches usually begin in the occipital area, left greater than right side, and then radiate for to the retro-orbital area. The patient may have nausea and vomiting with the headache, particularly if she does not take her Imitrex. The patient has photophobia and phonophobia. The patient goes on to say that she takes on average 2-3 Goody powders a day, and she drinks 1 L of Mountain Dew every day, in addition to iced tea. The patient indicates that heat exposure, perfumes, and weather changes may activate the headache. The patient also will have headaches during the "let down period", oftentimes occurring on weekends. The patient denies any numbness or weakness with the headaches. She does have some scalp tenderness with the headache. She returns to this office for an evaluation.  REVIEW OF SYSTEMS: Out of a complete 14 system review of symptoms, the patient complains only of the following symptoms, and all other reviewed systems are negative.  Blurred vision ALLERGIES: Allergies  Allergen Reactions  . Ampicillin Rash  . Azithromycin Diarrhea  . Penicillins Rash  . Erythromycin   . Other     CORNSTARCH -MIGRAINES  . Codeine Nausea Only    HOME MEDICATIONS: Outpatient Prescriptions Prior to Visit  Medication  Sig Dispense Refill  . Aspirin-Acetaminophen-Caffeine (GOODY HEADACHE PO) Take 1 packet by mouth every 6 (six) hours as needed (HEADACHES).     . cholecalciferol (VITAMIN D) 1000 UNITS tablet Take 1,000 Units by mouth daily.    . diazepam (VALIUM) 5 MG tablet TAKE 1/2 TO 1 TABLET EVERY 8 HOURS AS NEEDED 90 tablet 1  . diphenhydrAMINE (BENADRYL) 25 MG tablet Take 25 mg by  mouth every 6 (six) hours as needed for allergies or sleep.    Marland Kitchen doxycycline (VIBRA-TABS) 100 MG tablet Take 1 tablet (100 mg total) by mouth 2 (two) times daily. 28 tablet 0  . fluticasone (FLONASE) 50 MCG/ACT nasal spray One to 2 sprays each nostril at bedtime 16 g 6  . milk thistle 175 MG tablet Take 175 mg by mouth daily.    Marland Kitchen POTASSIUM PO Take by mouth.    . pseudoephedrine (SUDAFED) 30 MG tablet Take 60 mg by mouth every 4 (four) hours as needed for congestion.    . SUMAtriptan (IMITREX) 100 MG tablet TAKE 1 TABLET AT ONSET OF MIGRAINE HEADACHE MAY REPEAT ONCE IN 2 HOURS (MAX OF 2 TABLETS/24 HOURS) 10 tablet 1  . venlafaxine XR (EFFEXOR XR) 150 MG 24 hr capsule Take 1 capsule (150 mg total) by mouth daily with breakfast. 30 capsule 5  . vitamin B-12 (CYANOCOBALAMIN) 1000 MCG tablet Take 1,000 mcg by mouth daily.    . SUMAtriptan (IMITREX) 100 MG tablet May take 100mg  once, then repeat after two hours if needed. Not more than 200mg  in 24 hours. (Patient not taking: Reported on 01/28/2015) 10 tablet 4   No facility-administered medications prior to visit.    PAST MEDICAL HISTORY: Past Medical History  Diagnosis Date  . Anxiety   . Hx of migraine headaches   . Hyperlipidemia   . IBS (irritable bowel syndrome)     hx of, no problems  . History of kidney stones 1993  . Headache(784.0)     migraines, frequent  . Arthritis   . Anemia     hx of  . TMJ (dislocation of temporomandibular joint)     hx of    PAST SURGICAL HISTORY: Past Surgical History  Procedure Laterality Date  . Lasik  2006, 2012  . Vaginal hysterectomy  2002    has her right ovary  . Nasal septum surgery  1984  . Cholecystectomy N/A 06/22/2013    Procedure: LAPAROSCOPIC CHOLECYSTECTOMY;  Surgeon: Ralene Ok, MD;  Location: [facility];  Service: General;  Laterality: N/A;    FAMILY HISTORY: Family History  Problem Relation Age of Onset  . Hypertension Mother   . Uterine cancer Mother   . Cancer Mother      uterine  . Migraines Mother   . Heart attack Father 84  . Colon cancer Neg Hx   . Breast cancer Maternal Aunt   . Cancer Maternal Aunt     breast  . Migraines Daughter     SOCIAL HISTORY: History   Social History  . Marital Status: Married    Spouse Name: Mikki Santee  . Number of Children: 1  . Years of Education: college3   Occupational History  . [employer].   [employer] CLERK     Social History Main Topics  . Smoking status: Former Smoker -- 7 years    Types: Cigarettes    Quit date: 08/09/1978  . Smokeless tobacco: Never Used  . Alcohol Use: 0.0 oz/week    0 Standard drinks or  equivalent per week     Comment: rare  . Drug Use: No  . Sexual Activity: Not on file   Other Topics Concern  . Not on file   Social History Narrative   Patient lives at home husband Mikki Santee.    Patient has 1 child.    Patient has a college education.    Patient is right handed.       PHYSICAL EXAM  Filed Vitals:   01/28/15 1416  BP: 113/77  Pulse: 82  Height: 5\' 8"  (1.727 m)  Weight: 135 lb (61.236 kg)   Body mass index is 20.53 kg/(m^2).  Generalized: Well developed, in no acute distress   Neurological examination  Mentation: Alert oriented to time, place, history taking. Follows all commands speech and language fluent Cranial nerve II-XII: Pupils were equal round reactive to light. Extraocular movements were full, visual field were full on confrontational test. Facial sensation and strength were normal. Uvula tongue midline. Head turning and shoulder shrug  were normal and symmetric. Motor: The motor testing reveals 5 over 5 strength of all 4 extremities. Good symmetric motor tone is noted throughout.  Sensory: Sensory testing is intact to soft touch on all 4 extremities. No evidence of extinction is noted.  Coordination: Cerebellar testing reveals good finger-nose-finger and heel-to-shin bilaterally.  Gait and station: Gait is normal. Tandem gait is normal. Romberg is  negative. No drift is seen.  Reflexes: Deep tendon reflexes are symmetric and normal bilaterally.    DIAGNOSTIC DATA (LABS, IMAGING, TESTING) - I reviewed patient records, labs, notes, testing and imaging myself where available.    ASSESSMENT AND PLAN 54 y.o. year old female  has a past medical history of Anxiety; migraine headaches; Hyperlipidemia; IBS (irritable bowel syndrome); History of kidney stones (1993); Headache(784.0); Arthritis; Anemia; and TMJ (dislocation of temporomandibular joint). here with:  1. Migraine  The patient's migraines have improved. She did not have any headaches in the month of May. However in June she has had an increase in her headaches. For now the patient will continue taking Effexor. If her headaches continue to increase in frequency she will let me know. At that time we will consider additional medication. patient advised  to try to wean herself off of Goody powders. If she continues to have muscle aches she should follow up with her primary care provider. Patient verbalized understanding. She will follow-up in 6 months or sooner if needed.   Ward Givens, MSN, NP-C 01/28/2015, 2:47 PM [facility] [address] [place], [place] [address] [phone]  Note: This document was prepared with digital dictation and possible smart phrase technology. Any transcriptional errors that result from this process are unintentional.

## 2015-02-26 ENCOUNTER — Other Ambulatory Visit: Payer: Self-pay | Admitting: Family

## 2015-03-26 ENCOUNTER — Other Ambulatory Visit: Payer: Self-pay | Admitting: Family

## 2015-03-26 NOTE — Telephone Encounter (Signed)
Last seen 12/26/14  [facility] If approved route to nurse to call into  [facility]

## 2015-03-26 NOTE — Telephone Encounter (Signed)
HAWKS pt - seen in Jan. 2016

## 2015-03-27 ENCOUNTER — Other Ambulatory Visit: Payer: Self-pay

## 2015-03-27 MED ORDER — VENLAFAXINE HCL ER 150 MG PO CP24: 150.0000 mg | ORAL_CAPSULE | Freq: Every day | ORAL | Status: DC

## 2015-03-27 NOTE — Telephone Encounter (Signed)
Left message on pharmacy voicemail with refill authorization

## 2015-04-25 ENCOUNTER — Other Ambulatory Visit: Payer: Self-pay | Admitting: Family

## 2015-04-28 NOTE — Telephone Encounter (Signed)
Last seen 12/26/14 [facility]M  If approved route to nurse to call into [facility] 

## 2015-04-28 NOTE — Telephone Encounter (Signed)
DWM was acute visit - please address Theresa Pham .

## 2015-04-29 ENCOUNTER — Other Ambulatory Visit: Payer: Self-pay | Admitting: Family

## 2015-04-30 NOTE — Telephone Encounter (Signed)
Last seen 12/26/14  [facility]

## 2015-04-30 NOTE — Telephone Encounter (Signed)
This is okay to refill 

## 2015-06-02 ENCOUNTER — Other Ambulatory Visit: Payer: Self-pay | Admitting: Family

## 2015-06-02 NOTE — Telephone Encounter (Signed)
Last seen 12/26/14 [facility]M  If approved route to nurse to call into [facility] 

## 2015-06-03 ENCOUNTER — Other Ambulatory Visit: Payer: Self-pay | Admitting: Family

## 2015-06-04 NOTE — Telephone Encounter (Signed)
Diazepam called in to [facility].

## 2015-06-04 NOTE — Telephone Encounter (Signed)
Called to [facility] 

## 2015-06-04 NOTE — Telephone Encounter (Signed)
Last seen 12/26/14 [facility]M  If approved route to nurse to call into [facility] 

## 2015-06-13 ENCOUNTER — Other Ambulatory Visit: Payer: Self-pay | Admitting: Family Medicine

## 2015-07-01 ENCOUNTER — Other Ambulatory Visit: Payer: Self-pay | Admitting: Family

## 2015-07-02 NOTE — Telephone Encounter (Signed)
Last seen 12/26/14 [facility]M  If approved route to nurse to call into [facility] 

## 2015-07-30 ENCOUNTER — Encounter: Payer: Self-pay | Admitting: Neurology

## 2015-07-30 ENCOUNTER — Other Ambulatory Visit: Payer: Self-pay | Admitting: Family

## 2015-07-30 ENCOUNTER — Ambulatory Visit (INDEPENDENT_AMBULATORY_CARE_PROVIDER_SITE_OTHER): Payer: 59 | Admitting: Neurology

## 2015-07-30 VITALS — BP 123/73 | HR 97 | Ht 67.5 in | Wt 132.0 lb

## 2015-07-30 DIAGNOSIS — G43009 Migraine without aura, not intractable, without status migrainosus: Secondary | ICD-10-CM

## 2015-07-30 NOTE — Patient Instructions (Signed)

## 2015-07-30 NOTE — Telephone Encounter (Signed)
Not seen since 08/2014. Route to pool, whether approved or denied

## 2015-07-30 NOTE — Progress Notes (Signed)
Reason for visit: Migraine headache  Theresa Pham is an 54 y.o. female  History of present illness:  Theresa Pham is a 54 year old right-handed white female with a history of migraine headaches. The patient has been doing relatively well with her headaches, she will average about one a week. She indicates that weather changes and stress will bring on headaches. She has tried to cut back on her caffeine intake. She was drinking almost 2 L of [facility] a day, now she is drinking about 24 ounces a day, and occasional ice tea, and she takes 2 Goody powders a day. The patient takes Imitrex for her headaches when they do come on, this remains effective. The patient sleeps fairly well. She has not missed work in some time because of the migraine. She is on Effexor to prevent the headaches. She indicates that her primary care physician is writing prescriptions for all of her medications at this time.  Past Medical History  Diagnosis Date  . Anxiety   . Hx of migraine headaches   . Hyperlipidemia   . IBS (irritable bowel syndrome)     hx of, no problems  . History of kidney stones 1993  . Headache(784.0)     migraines, frequent  . Arthritis   . Anemia     hx of  . TMJ (dislocation of temporomandibular joint)     hx of    Past Surgical History  Procedure Laterality Date  . Lasik  2006, 2012  . Vaginal hysterectomy  2002    has her right ovary  . Nasal septum surgery  1984  . Cholecystectomy N/A 06/22/2013    Procedure: LAPAROSCOPIC CHOLECYSTECTOMY;  Surgeon: Ralene Ok, MD;  Location: [facility];  Service: General;  Laterality: N/A;    Family History  Problem Relation Age of Onset  . Hypertension Mother   . Uterine cancer Mother   . Cancer Mother     uterine  . Migraines Mother   . Heart attack Father 37  . Colon cancer Neg Hx   . Breast cancer Maternal Aunt   . Cancer Maternal Aunt     breast  . Migraines Daughter     Social history:  reports that she quit  smoking about 36 years ago. Her smoking use included Cigarettes. She quit after 7 years of use. She has never used smokeless tobacco. She reports that she does not drink alcohol or use illicit drugs.    Allergies  Allergen Reactions  . Ampicillin Rash  . Azithromycin Diarrhea  . Penicillins Rash  . Erythromycin   . Other     CORNSTARCH -MIGRAINES  . Codeine Nausea Only    Medications:  Prior to Admission medications   Medication Sig Start Date End Date Taking? Authorizing Provider  Aspirin-Acetaminophen-Caffeine (GOODY HEADACHE PO) Take 1 packet by mouth every 6 (six) hours as needed (HEADACHES).    Yes Historical Provider, MD  cholecalciferol (VITAMIN D) 1000 UNITS tablet Take 1,000 Units by mouth daily.   Yes Historical Provider, MD  diazepam (VALIUM) 5 MG tablet TAKE 1/2 TO 1 TABLET EVERY 8 HOURS AS NEEDED 07/02/15  Yes Chipper Herb, MD  diphenhydrAMINE (BENADRYL) 25 MG tablet Take 25 mg by mouth every 6 (six) hours as needed for allergies or sleep.   Yes Historical Provider, MD  fluticasone Asencion Islam) 50 MCG/ACT nasal spray One to 2 sprays each nostril at bedtime 12/26/14  Yes Chipper Herb, MD  milk thistle 175 MG tablet Take  175 mg by mouth daily.   Yes Historical Provider, MD  POTASSIUM PO Take by mouth.   Yes Historical Provider, MD  pseudoephedrine (SUDAFED) 30 MG tablet Take 60 mg by mouth every 4 (four) hours as needed for congestion.   Yes Historical Provider, MD  SUMAtriptan (IMITREX) 100 MG tablet TAKE 1 TABLET AT ONSET OF MIGRAINE HEADACHE MAY REPEAT ONCE IN 2 HOURS (MAX OF 2 TABLETS/24 HOURS) 06/13/15  Yes Sharion Balloon, FNP  venlafaxine XR (EFFEXOR XR) 150 MG 24 hr capsule Take 1 capsule (150 mg total) by mouth daily with breakfast. 03/27/15  Yes Kathrynn Ducking, MD  vitamin B-12 (CYANOCOBALAMIN) 1000 MCG tablet Take 1,000 mcg by mouth daily.   Yes Historical Provider, MD    ROS:  Out of a complete 14 system review of symptoms, the patient complains only of the  following symptoms, and all other reviewed systems are negative.  Environmental allergies, food allergies Joint pain, back pain, achy muscles Headache  Blood pressure 123/73, pulse 97, height 5' 7.5" (1.715 m), weight 132 lb (59.875 kg).  Physical Exam  General: The patient is alert and cooperative at the time of the examination.  Skin: No significant peripheral edema is noted.   Neurologic Exam  Mental status: The patient is alert and oriented x 3 at the time of the examination. The patient has apparent normal recent and remote memory, with an apparently normal attention span and concentration ability.   Cranial nerves: Facial symmetry is present. Speech is normal, no aphasia or dysarthria is noted. Extraocular movements are full. Visual fields are full.  Motor: The patient has good strength in all 4 extremities.  Sensory examination: Soft touch sensation is symmetric on the face, arms, and legs.  Coordination: The patient has good finger-nose-finger and heel-to-shin bilaterally.  Gait and station: The patient has a normal gait. Tandem gait is normal. Romberg is negative. No drift is seen.  Reflexes: Deep tendon reflexes are symmetric.   Assessment/Plan:  1. Migraine headache  The patient continues to overuse caffeinated products. Her headaches have improved however. She gets all of her medications through her primary care physician, we can have the patient call our office if the headaches are getting worse and she needs further assistance. Otherwise, the patient can be seen through this office on an as-needed basis.  Jill Alexanders MD 07/30/2015 6:30 PM  [facility] [address] [place], [place] [address]  Phone [phone] Fax [phone]

## 2015-08-18 ENCOUNTER — Other Ambulatory Visit: Payer: Self-pay | Admitting: Family

## 2015-08-18 ENCOUNTER — Other Ambulatory Visit: Payer: Self-pay | Admitting: Internal Medicine

## 2015-09-15 ENCOUNTER — Other Ambulatory Visit: Payer: Self-pay

## 2015-09-15 MED ORDER — VENLAFAXINE HCL ER 150 MG PO CP24: 150.0000 mg | ORAL_CAPSULE | Freq: Every day | ORAL | Status: DC

## 2015-09-22 ENCOUNTER — Other Ambulatory Visit: Payer: Self-pay | Admitting: Family

## 2015-10-06 ENCOUNTER — Other Ambulatory Visit: Payer: Self-pay | Admitting: Family

## 2015-10-06 NOTE — Telephone Encounter (Signed)
rx called to pharmacy 

## 2015-10-06 NOTE — Telephone Encounter (Signed)
Last filled 08/18/15, last seen 08/24/15. Route to pool[facility]

## 2015-11-10 ENCOUNTER — Other Ambulatory Visit: Payer: Self-pay | Admitting: Family

## 2015-11-11 NOTE — Telephone Encounter (Signed)
Last seen 12/26/14 [place] PCP  If approved route to nurse to [facility] into [facility]

## 2015-12-01 ENCOUNTER — Encounter: Payer: Self-pay | Admitting: *Deleted

## 2015-12-01 ENCOUNTER — Encounter: Payer: Self-pay | Admitting: Family

## 2015-12-01 ENCOUNTER — Ambulatory Visit (INDEPENDENT_AMBULATORY_CARE_PROVIDER_SITE_OTHER): Payer: 59 | Admitting: Family

## 2015-12-01 VITALS — BP 122/80 | HR 101 | Temp 96.5°F | Ht 67.5 in | Wt 130.6 lb

## 2015-12-01 DIAGNOSIS — Z0000 Encounter for general adult medical examination without abnormal findings: Secondary | ICD-10-CM | POA: Diagnosis not present

## 2015-12-01 DIAGNOSIS — F411 Generalized anxiety disorder: Secondary | ICD-10-CM

## 2015-12-01 DIAGNOSIS — Z1159 Encounter for screening for other viral diseases: Secondary | ICD-10-CM

## 2015-12-01 DIAGNOSIS — Z01419 Encounter for gynecological examination (general) (routine) without abnormal findings: Secondary | ICD-10-CM | POA: Diagnosis not present

## 2015-12-01 DIAGNOSIS — G43009 Migraine without aura, not intractable, without status migrainosus: Secondary | ICD-10-CM

## 2015-12-01 DIAGNOSIS — E876 Hypokalemia: Secondary | ICD-10-CM | POA: Insufficient documentation

## 2015-12-01 DIAGNOSIS — J309 Allergic rhinitis, unspecified: Secondary | ICD-10-CM | POA: Insufficient documentation

## 2015-12-01 DIAGNOSIS — E559 Vitamin D deficiency, unspecified: Secondary | ICD-10-CM

## 2015-12-01 DIAGNOSIS — Z114 Encounter for screening for human immunodeficiency virus [HIV]: Secondary | ICD-10-CM

## 2015-12-01 DIAGNOSIS — E785 Hyperlipidemia, unspecified: Secondary | ICD-10-CM

## 2015-12-01 LAB — HM MAMMOGRAPHY

## 2015-12-01 NOTE — Patient Instructions (Signed)
Health Maintenance, Female Adopting a healthy lifestyle and getting preventive care can go a long way to promote health and wellness. Talk with your health care provider about what schedule of regular examinations is right for you. This is a good chance for you to check in with your provider about disease prevention and staying healthy. In between checkups, there are plenty of things you can do on your own. Experts have done a lot of research about which lifestyle changes and preventive measures are most likely to keep you healthy. Ask your health care provider for more information. WEIGHT AND DIET  Eat a healthy diet  Be sure to include plenty of vegetables, fruits, low-fat dairy products, and lean protein.  Do not eat a lot of foods high in solid fats, added sugars, or salt.  Get regular exercise. This is one of the most important things you can do for your health.  Most adults should exercise for at least 150 minutes each week. The exercise should increase your heart rate and make you sweat (moderate-intensity exercise).  Most adults should also do strengthening exercises at least twice a week. This is in addition to the moderate-intensity exercise.  Maintain a healthy weight  Body mass index (BMI) is a measurement that can be used to identify possible weight problems. It estimates body fat based on height and weight. Your health care provider can help determine your BMI and help you achieve or maintain a healthy weight.  For females 20 years of age and older:   A BMI below 18.5 is considered underweight.  A BMI of 18.5 to 24.9 is normal.  A BMI of 25 to 29.9 is considered overweight.  A BMI of 30 and above is considered obese.  Watch levels of cholesterol and blood lipids  You should start having your blood tested for lipids and cholesterol at 56 years of age, then have this test every 5 years.  You may need to have your cholesterol levels checked more often if:  Your lipid  or cholesterol levels are high.  You are older than 56 years of age.  You are at high risk for heart disease.  CANCER SCREENING   Lung Cancer  Lung cancer screening is recommended for adults 55-80 years old who are at high risk for lung cancer because of a history of smoking.  A yearly low-dose CT scan of the lungs is recommended for people who:  Currently smoke.  Have quit within the past 15 years.  Have at least a 30-pack-year history of smoking. A pack year is smoking an average of one pack of cigarettes a day for 1 year.  Yearly screening should continue until it has been 15 years since you quit.  Yearly screening should stop if you develop a health problem that would prevent you from having lung cancer treatment.  Breast Cancer  Practice breast self-awareness. This means understanding how your breasts normally appear and feel.  It also means doing regular breast self-exams. Let your health care provider know about any changes, no matter how small.  If you are in your 20s or 30s, you should have a clinical breast exam (CBE) by a health care provider every 1-3 years as part of a regular health exam.  If you are 40 or older, have a CBE every year. Also consider having a breast X-ray (mammogram) every year.  If you have a family history of breast cancer, talk to your health care provider about genetic screening.  If you   are at high risk for breast cancer, talk to your health care provider about having an MRI and a mammogram every year.  Breast cancer gene (BRCA) assessment is recommended for women who have family members with BRCA-related cancers. BRCA-related cancers include:  Breast.  Ovarian.  Tubal.  Peritoneal cancers.  Results of the assessment will determine the need for genetic counseling and BRCA1 and BRCA2 testing. Cervical Cancer Your health care provider may recommend that you be screened regularly for cancer of the pelvic organs (ovaries, uterus, and  vagina). This screening involves a pelvic examination, including checking for microscopic changes to the surface of your cervix (Pap test). You may be encouraged to have this screening done every 3 years, beginning at age 21.  For women ages 30-65, health care providers may recommend pelvic exams and Pap testing every 3 years, or they may recommend the Pap and pelvic exam, combined with testing for human papilloma virus (HPV), every 5 years. Some types of HPV increase your risk of cervical cancer. Testing for HPV may also be done on women of any age with unclear Pap test results.  Other health care providers may not recommend any screening for nonpregnant women who are considered low risk for pelvic cancer and who do not have symptoms. Ask your health care provider if a screening pelvic exam is right for you.  If you have had past treatment for cervical cancer or a condition that could lead to cancer, you need Pap tests and screening for cancer for at least 20 years after your treatment. If Pap tests have been discontinued, your risk factors (such as having a new sexual partner) need to be reassessed to determine if screening should resume. Some women have medical problems that increase the chance of getting cervical cancer. In these cases, your health care provider may recommend more frequent screening and Pap tests. Colorectal Cancer  This type of cancer can be detected and often prevented.  Routine colorectal cancer screening usually begins at 56 years of age and continues through 56 years of age.  Your health care provider may recommend screening at an earlier age if you have risk factors for colon cancer.  Your health care provider may also recommend using home test kits to check for hidden blood in the stool.  A small camera at the end of a tube can be used to examine your colon directly (sigmoidoscopy or colonoscopy). This is done to check for the earliest forms of colorectal  cancer.  Routine screening usually begins at age 50.  Direct examination of the colon should be repeated every 5-10 years through 56 years of age. However, you may need to be screened more often if early forms of precancerous polyps or small growths are found. Skin Cancer  Check your skin from head to toe regularly.  Tell your health care provider about any new moles or changes in moles, especially if there is a change in a mole's shape or color.  Also tell your health care provider if you have a mole that is larger than the size of a pencil eraser.  Always use sunscreen. Apply sunscreen liberally and repeatedly throughout the day.  Protect yourself by wearing long sleeves, pants, a wide-brimmed hat, and sunglasses whenever you are outside. HEART DISEASE, DIABETES, AND HIGH BLOOD PRESSURE   High blood pressure causes heart disease and increases the risk of stroke. High blood pressure is more likely to develop in:  People who have blood pressure in the high end   of the normal range (130-139/85-89 mm Hg).  People who are overweight or obese.  People who are African American.  If you are 38-23 years of age, have your blood pressure checked every 3-5 years. If you are 61 years of age or older, have your blood pressure checked every year. You should have your blood pressure measured twice--once when you are at a hospital or clinic, and once when you are not at a hospital or clinic. Record the average of the two measurements. To check your blood pressure when you are not at a hospital or clinic, you can use:  An automated blood pressure machine at a pharmacy.  A home blood pressure monitor.  If you are between 45 years and 39 years old, ask your health care provider if you should take aspirin to prevent strokes.  Have regular diabetes screenings. This involves taking a blood sample to check your fasting blood sugar level.  If you are at a normal weight and have a low risk for diabetes,  have this test once every three years after 56 years of age.  If you are overweight and have a high risk for diabetes, consider being tested at a younger age or more often. PREVENTING INFECTION  Hepatitis B  If you have a higher risk for hepatitis B, you should be screened for this virus. You are considered at high risk for hepatitis B if:  You were born in a country where hepatitis B is common. Ask your health care provider which countries are considered high risk.  Your parents were born in a high-risk country, and you have not been immunized against hepatitis B (hepatitis B vaccine).  You have HIV or AIDS.  You use needles to inject street drugs.  You live with someone who has hepatitis B.  You have had sex with someone who has hepatitis B.  You get hemodialysis treatment.  You take certain medicines for conditions, including cancer, organ transplantation, and autoimmune conditions. Hepatitis C  Blood testing is recommended for:  Everyone born from [birth year] through 1966.  Anyone with known risk factors for hepatitis C. Sexually transmitted infections (STIs)  You should be screened for sexually transmitted infections (STIs) including gonorrhea and chlamydia if:  You are sexually active and are younger than 56 years of age.  You are older than 56 years of age and your health care provider tells you that you are at risk for this type of infection.  Your sexual activity has changed since you were last screened and you are at an increased risk for chlamydia or gonorrhea. Ask your health care provider if you are at risk.  If you do not have HIV, but are at risk, it may be recommended that you take a prescription medicine daily to prevent HIV infection. This is called pre-exposure prophylaxis (PrEP). You are considered at risk if:  You are sexually active and do not regularly use condoms or know the HIV status of your partner(s).  You take drugs by injection.  You are sexually  active with a partner who has HIV. Talk with your health care provider about whether you are at high risk of being infected with HIV. If you choose to begin PrEP, you should first be tested for HIV. You should then be tested every 3 months for as long as you are taking PrEP.  PREGNANCY   If you are premenopausal and you may become pregnant, ask your health care provider about preconception counseling.  If you may  become pregnant, take 400 to 800 micrograms (mcg) of folic acid every day.  If you want to prevent pregnancy, talk to your health care provider about birth control (contraception). OSTEOPOROSIS AND MENOPAUSE   Osteoporosis is a disease in which the bones lose minerals and strength with aging. This can result in serious bone fractures. Your risk for osteoporosis can be identified using a bone density scan.  If you are 61 years of age or older, or if you are at risk for osteoporosis and fractures, ask your health care provider if you should be screened.  Ask your health care provider whether you should take a calcium or vitamin D supplement to lower your risk for osteoporosis.  Menopause may have certain physical symptoms and risks.  Hormone replacement therapy may reduce some of these symptoms and risks. Talk to your health care provider about whether hormone replacement therapy is right for you.  HOME CARE INSTRUCTIONS   Schedule regular health, dental, and eye exams.  Stay current with your immunizations.   Do not use any tobacco products including cigarettes, chewing tobacco, or electronic cigarettes.  If you are pregnant, do not drink alcohol.  If you are breastfeeding, limit how much and how often you drink alcohol.  Limit alcohol intake to no more than 1 drink per day for nonpregnant women. One drink equals 12 ounces of beer, 5 ounces of wine, or 1 ounces of hard liquor.  Do not use street drugs.  Do not share needles.  Ask your health care provider for help if  you need support or information about quitting drugs.  Tell your health care provider if you often feel depressed.  Tell your health care provider if you have ever been abused or do not feel safe at home.   This information is not intended to replace advice given to you by your health care provider. Make sure you discuss any questions you have with your health care provider.   Document Released: 02/08/2011 Document Revised: 08/16/2014 Document Reviewed: 06/27/2013 Elsevier Interactive Patient Education [employer].

## 2015-12-01 NOTE — Progress Notes (Signed)
Subjective:    Patient ID: Theresa Pham, female    DOB: 11/04/1960, 55 y.o.   MRN: [id number]  PT presents to the office today for CPE and pap. Pt has chronic migraines and has neurologists, Dr Floyde Parkins. Gynecologic Exam Pertinent negatives include no headaches.  Hyperlipidemia This is a chronic problem. The current episode started more than 1 year ago. The problem is uncontrolled. Recent lipid tests were reviewed and are high. She has no history of diabetes or hypothyroidism. Factors aggravating her hyperlipidemia include smoking. Pertinent negatives include no leg pain or shortness of breath. Current antihyperlipidemic treatment includes diet change (Pt states she does not want to be on cholesterol medicatiions). The current treatment provides no improvement of lipids. Compliance problems include medication side effects.  Risk factors for coronary artery disease include dyslipidemia, post-menopausal and family history.  Anxiety Presents for follow-up visit. Onset was more than 5 years ago. The problem has been gradually improving. Patient reports no depressed mood, excessive worry, irritability, nervous/anxious behavior, palpitations, panic, restlessness or shortness of breath. Symptoms occur rarely. The severity of symptoms is mild. The quality of sleep is good.   Her past medical history is significant for anxiety/panic attacks. There is no history of depression. Past treatments include benzodiazephines and SSRIs. The treatment provided moderate relief. Compliance with prior treatments has been good.  Migraine  This is a chronic problem. The current episode started more than 1 year ago. The problem occurs monthly. The problem has been waxing and waning. The pain is located in the left unilateral region. The pain quality is similar to prior headaches. The quality of the pain is described as aching. The pain is at a severity of 10/10. The pain is moderate. Associated symptoms include  phonophobia and photophobia. Pertinent negatives include no eye redness or eye watering. The symptoms are aggravated by sneezing. She has tried NSAIDs and triptans for the symptoms. The treatment provided moderate relief. Her past medical history is significant for migraine headaches.      Review of Systems  Constitutional: Negative.  Negative for irritability.  HENT: Negative.   Eyes: Positive for photophobia. Negative for redness.  Respiratory: Negative.  Negative for shortness of breath.   Cardiovascular: Negative.  Negative for palpitations.  Gastrointestinal: Negative.   Endocrine: Negative.   Genitourinary: Negative.   Musculoskeletal: Negative.   Neurological: Negative.  Negative for headaches.  Hematological: Negative.   Psychiatric/Behavioral: Negative.  The patient is not nervous/anxious.   All other systems reviewed and are negative.      Objective:   Physical Exam  Constitutional: She is oriented to person, place, and time. She appears well-developed and well-nourished. No distress.  HENT:  Head: Normocephalic and atraumatic.  Right Ear: External ear normal.  Left Ear: External ear normal.  Nose: Nose normal.  Mouth/Throat: Oropharynx is clear and moist.  Eyes: Pupils are equal, round, and reactive to light.  Neck: Normal range of motion. Neck supple. No thyromegaly present.  Cardiovascular: Normal rate, regular rhythm, normal heart sounds and intact distal pulses.   No murmur heard. Pulmonary/Chest: Effort normal and breath sounds normal. No respiratory distress. She has no wheezes. Right breast exhibits no inverted nipple, no mass, no nipple discharge, no skin change and no tenderness. Left breast exhibits no inverted nipple, no mass, no nipple discharge, no skin change and no tenderness. Breasts are symmetrical.  Abdominal: Soft. Bowel sounds are normal. She exhibits no distension. There is no tenderness.  Genitourinary: Vagina normal.  Bimanual  exam- no adnexal  masses or tenderness, ovaries nonpalpable   Cervix not present- No discharge   Musculoskeletal: Normal range of motion. She exhibits no edema or tenderness.  Neurological: She is alert and oriented to person, place, and time. She has normal reflexes. No cranial nerve deficit.  Skin: Skin is warm and dry.  Psychiatric: She has a normal mood and affect. Her behavior is normal. Judgment and thought content normal.  Vitals reviewed.     BP 122/80 mmHg  Pulse 101  Temp(Src) 96.5 F (35.8 C) (Oral)  Ht 5' 7.5" (1.715 m)  Wt 130 lb 9.6 oz (59.24 kg)  BMI 20.14 kg/m2     Assessment & Plan:  1. Generalized anxiety disorder - CMP14+EGFR  2. Migraine without aura and without status migrainosus, not intractable - CMP14+EGFR  3. Hyperlipidemia - CMP14+EGFR - Lipid panel -Pt does not want to be on cholesterol medications. Pt wants to continue with diet and exercise.   4. Vitamin D deficiency - CMP14+EGFR - VITAMIN D 25 Hydroxy (Vit-D Deficiency, Fractures)  5. Hypokalemia - CMP14+EGFR  6. Allergic rhinitis, unspecified allergic rhinitis type - CMP14+EGFR  7. Need for hepatitis C screening test - CMP14+EGFR  8. Screening for HIV (human immunodeficiency virus) - CMP14+EGFR  9. Annual physical exam - Anemia Profile B - CMP14+EGFR - Lipid panel - Thyroid Panel With TSH - VITAMIN D 25 Hydroxy (Vit-D Deficiency, Fractures) - Pap IG w/ reflex to HPV when ASC-U - HIV antibody - Hepatitis C Antibody  10. Encounter for routine gynecological examination  - Pap IG w/ reflex to HPV when ASC-U   Continue all meds Labs pending Health Maintenance reviewed Diet and exercise encouraged RTO 6 months  Evelina Dun, FNP

## 2015-12-02 LAB — LIPID PANEL
CHOL/HDL RATIO: 4.6 ratio — AB (ref 0.0–4.4)
CHOLESTEROL TOTAL: 210 mg/dL — AB (ref 100–199)
HDL: 46 mg/dL (ref >39)
LDL Calculated: 147 mg/dL — ABNORMAL HIGH (ref 0–99)
TRIGLYCERIDES: 87 mg/dL (ref 0–149)
VLDL Cholesterol Cal: 17 mg/dL (ref 5–40)

## 2015-12-02 LAB — CMP14+EGFR
ALK PHOS: 78 IU/L (ref 39–117)
ALT: 10 IU/L (ref 0–32)
AST: 17 IU/L (ref 0–40)
Albumin/Globulin Ratio: 2.1 (ref 1.2–2.2)
Albumin: 4.7 g/dL (ref 3.5–5.5)
BUN / CREAT RATIO: 14 (ref 9–23)
BUN: 10 mg/dL (ref 6–24)
CHLORIDE: 103 mmol/L (ref 96–106)
CO2: 23 mmol/L (ref 18–29)
Calcium: 9.9 mg/dL (ref 8.7–10.2)
Creatinine, Ser: 0.71 mg/dL (ref 0.57–1.00)
GFR calc Af Amer: 110 mL/min/{1.73_m2} (ref >59)
GFR calc non Af Amer: 96 mL/min/{1.73_m2} (ref >59)
GLUCOSE: 70 mg/dL (ref 65–99)
Globulin, Total: 2.2 g/dL (ref 1.5–4.5)
Potassium: 4.2 mmol/L (ref 3.5–5.2)
Sodium: 143 mmol/L (ref 134–144)
Total Protein: 6.9 g/dL (ref 6.0–8.5)

## 2015-12-02 LAB — ANEMIA PROFILE B
BASOS ABS: 0 10*3/uL (ref 0.0–0.2)
BASOS: 0 %
EOS (ABSOLUTE): 0.2 10*3/uL (ref 0.0–0.4)
Eos: 3 %
FOLATE: 12.9 ng/mL (ref >3.0)
Ferritin: 37 ng/mL (ref 15–150)
HEMATOCRIT: 40.3 % (ref 34.0–46.6)
Hemoglobin: 12.9 g/dL (ref 11.1–15.9)
IMMATURE GRANS (ABS): 0 10*3/uL (ref 0.0–0.1)
IRON: 32 ug/dL (ref 27–159)
Immature Granulocytes: 0 %
Iron Saturation: 11 % — ABNORMAL LOW (ref 15–55)
LYMPHS: 37 %
Lymphocytes Absolute: 3.1 10*3/uL (ref 0.7–3.1)
MCH: 30.9 pg (ref 26.6–33.0)
MCHC: 32 g/dL (ref 31.5–35.7)
MCV: 97 fL (ref 79–97)
MONOCYTES: 6 %
Monocytes Absolute: 0.5 10*3/uL (ref 0.1–0.9)
Neutrophils Absolute: 4.5 10*3/uL (ref 1.4–7.0)
Neutrophils: 54 %
PLATELETS: 342 10*3/uL (ref 150–379)
RBC: 4.17 x10E6/uL (ref 3.77–5.28)
RDW: 15.5 % — AB (ref 12.3–15.4)
RETIC CT PCT: 0.9 % (ref 0.6–2.6)
TIBC: 303 ug/dL (ref 250–450)
UIBC: 271 ug/dL (ref 131–425)
VITAMIN B 12: 526 pg/mL (ref 211–946)
WBC: 8.4 10*3/uL (ref 3.4–10.8)

## 2015-12-02 LAB — THYROID PANEL WITH TSH
FREE THYROXINE INDEX: 1.6 (ref 1.2–4.9)
T3 UPTAKE RATIO: 27 % (ref 24–39)
T4, Total: 5.9 ug/dL (ref 4.5–12.0)
TSH: 1.74 u[IU]/mL (ref 0.450–4.500)

## 2015-12-02 LAB — HEPATITIS C ANTIBODY

## 2015-12-02 LAB — VITAMIN D 25 HYDROXY (VIT D DEFICIENCY, FRACTURES): VIT D 25 HYDROXY: 34.1 ng/mL (ref 30.0–100.0)

## 2015-12-02 LAB — HIV ANTIBODY (ROUTINE TESTING W REFLEX): HIV Screen 4th Generation wRfx: NONREACTIVE

## 2015-12-03 ENCOUNTER — Telehealth: Payer: Self-pay | Admitting: Family

## 2015-12-03 ENCOUNTER — Other Ambulatory Visit: Payer: Self-pay | Admitting: Family

## 2015-12-03 LAB — PAP IG W/ RFLX HPV ASCU: PAP SMEAR COMMENT: 0

## 2015-12-03 MED ORDER — SULFAMETHOXAZOLE-TRIMETHOPRIM 800-160 MG PO TABS
1.0000 | ORAL_TABLET | Freq: Two times a day (BID) | ORAL | Status: DC
Start: 2015-12-03 — End: 2016-03-31

## 2015-12-03 NOTE — Telephone Encounter (Signed)
Bactrim Prescription sent to pharmacy  °

## 2015-12-03 NOTE — Telephone Encounter (Signed)
Patient aware that rx for bactrim has been sent to the pharmacy

## 2015-12-09 ENCOUNTER — Other Ambulatory Visit: Payer: Self-pay | Admitting: Family

## 2015-12-09 NOTE — Telephone Encounter (Signed)
rx called into pharmacy

## 2015-12-09 NOTE — Telephone Encounter (Signed)
Last seen 12/01/15  Theresa Pham  If approved route to nurse to call into [facility]

## 2015-12-10 ENCOUNTER — Encounter: Payer: Self-pay | Admitting: *Deleted

## 2016-02-13 ENCOUNTER — Other Ambulatory Visit: Payer: Self-pay | Admitting: Family

## 2016-02-16 NOTE — Telephone Encounter (Signed)
Last seen 12/01/15  [facility]

## 2016-03-08 ENCOUNTER — Other Ambulatory Visit: Payer: Self-pay | Admitting: Neurology

## 2016-03-12 ENCOUNTER — Other Ambulatory Visit: Payer: Self-pay | Admitting: Family

## 2016-03-12 DIAGNOSIS — G43009 Migraine without aura, not intractable, without status migrainosus: Secondary | ICD-10-CM

## 2016-03-12 NOTE — Telephone Encounter (Signed)
I placed referral neurologists because she is taking too much imitrex. Imitrex reordered.

## 2016-03-24 ENCOUNTER — Encounter: Payer: Self-pay | Admitting: Family

## 2016-03-31 ENCOUNTER — Ambulatory Visit (INDEPENDENT_AMBULATORY_CARE_PROVIDER_SITE_OTHER): Payer: 59 | Admitting: Family Medicine

## 2016-03-31 ENCOUNTER — Encounter: Payer: Self-pay | Admitting: Family Medicine

## 2016-03-31 VITALS — BP 124/80 | HR 78 | Temp 97.2°F | Ht 67.5 in | Wt 132.0 lb

## 2016-03-31 DIAGNOSIS — R109 Unspecified abdominal pain: Secondary | ICD-10-CM

## 2016-03-31 DIAGNOSIS — M545 Low back pain, unspecified: Secondary | ICD-10-CM

## 2016-03-31 LAB — MICROSCOPIC EXAMINATION
Bacteria, UA: NONE SEEN
WBC UA: NONE SEEN /HPF (ref 0–?)

## 2016-03-31 LAB — URINALYSIS, COMPLETE
Bilirubin, UA: NEGATIVE
GLUCOSE, UA: NEGATIVE
KETONES UA: NEGATIVE
Leukocytes, UA: NEGATIVE
NITRITE UA: NEGATIVE
Protein, UA: NEGATIVE
UUROB: 0.2 mg/dL (ref 0.2–1.0)
pH, UA: 5.5 (ref 5.0–7.5)

## 2016-03-31 NOTE — Progress Notes (Signed)
BP 124/80   Pulse 78   Temp 97.2 F (36.2 C) (Oral)   Ht 5' 7.5" (1.715 m)   Wt 132 lb (59.9 kg)   BMI 20.37 kg/m    Subjective:    Patient ID: Theresa Pham, female    DOB: 11-25-1960, 55 y.o.   MRN: [id number]  HPI: Theresa Pham is a 55 y.o. female presenting on 03/31/2016 for RLQ pain (pt here today c/o RLQ pain that started this morning and has progressively gotten worse.)   HPI Right lower quadrant abdominal pain Patient has been having right lower quadrant sharp abdominal pain that started early this morning awoke her from sleep. She has had similar pain before when she had a renal stone many years ago and does not know if that's what this is or if it is something else. She denies any fevers or chills or nausea or vomiting. Her appetite has been decreased from where it usually is. It's been 2 days since she has had a bowel movement but that is normal for her. She denies any diarrhea or blood in her stool. She denies any hematuria. The pain does not radiate anywhere else. Patient does not have a gallbladder, she also does not have a uterus. She denies any vaginal discharge or irritation or pain.  Relevant past medical, surgical, family and social history reviewed and updated as indicated. Interim medical history since our last visit reviewed. Allergies and medications reviewed and updated.  Review of Systems  Constitutional: Negative for chills and fever.  HENT: Negative for congestion, ear discharge and ear pain.   Eyes: Negative for redness and visual disturbance.  Respiratory: Negative for chest tightness and shortness of breath.   Cardiovascular: Negative for chest pain and leg swelling.  Gastrointestinal: Positive for abdominal pain. Negative for anal bleeding, blood in stool, constipation, diarrhea, nausea, rectal pain and vomiting.  Genitourinary: Positive for dysuria and frequency. Negative for decreased urine volume, difficulty urinating, hematuria, urgency,  vaginal bleeding, vaginal discharge and vaginal pain.  Musculoskeletal: Negative for back pain and gait problem.  Skin: Negative for rash.  Neurological: Negative for light-headedness and headaches.  Psychiatric/Behavioral: Negative for agitation and behavioral problems.  All other systems reviewed and are negative.   Per HPI unless specifically indicated above     Medication List       Accurate as of 03/31/16  2:50 PM. Always use your most recent med list.          cholecalciferol 1000 units tablet Commonly known as:  VITAMIN D Take 1,000 Units by mouth daily.   diazepam 5 MG tablet Commonly known as:  VALIUM TAKE 1/2 TO 1 TABLET EVERY 8 HOURS AS NEEDED   diphenhydrAMINE 25 MG tablet Commonly known as:  BENADRYL Take 25 mg by mouth every 6 (six) hours as needed for allergies or sleep.   fluticasone 50 MCG/ACT nasal spray Commonly known as:  FLONASE One to 2 sprays each nostril at bedtime   GOODY HEADACHE PO Take 1 packet by mouth every 6 (six) hours as needed (HEADACHES).   POTASSIUM PO Take by mouth.   SUMAtriptan 100 MG tablet Commonly known as:  IMITREX TAKE 1 TABLET AT ONSET OF MIGRAINE HEADACHE MAY REPEAT ONCE IN 2 HOURS (MAX OF 2 TABLETS/24 HOURS)   venlafaxine XR 150 MG 24 hr capsule Commonly known as:  EFFEXOR-XR TAKE ONE CAPSULE EACH MORNING   vitamin B-12 1000 MCG tablet Commonly known as:  CYANOCOBALAMIN Take 1,000 mcg by mouth  daily.          Objective:    BP 124/80   Pulse 78   Temp 97.2 F (36.2 C) (Oral)   Ht 5' 7.5" (1.715 m)   Wt 132 lb (59.9 kg)   BMI 20.37 kg/m   Wt Readings from Last 3 Encounters:  03/31/16 132 lb (59.9 kg)  12/01/15 130 lb 9.6 oz (59.2 kg)  07/30/15 132 lb (59.9 kg)    Physical Exam  Constitutional: She is oriented to person, place, and time. She appears well-developed and well-nourished. No distress.  Eyes: Conjunctivae and EOM are normal. Pupils are equal, round, and reactive to light.  Cardiovascular:  Normal rate, regular rhythm, normal heart sounds and intact distal pulses.   No murmur heard. Pulmonary/Chest: Effort normal and breath sounds normal. No respiratory distress. She has no wheezes.  Abdominal: Soft. Bowel sounds are normal. She exhibits no distension and no mass. There is no hepatosplenomegaly. There is tenderness in the right lower quadrant. There is tenderness at McBurney's point. There is no rigidity, no rebound, no guarding, no CVA tenderness and negative Murphy's sign.  Musculoskeletal: Normal range of motion. She exhibits no edema or tenderness.  Neurological: She is alert and oriented to person, place, and time. Coordination normal.  Skin: Skin is warm and dry. No rash noted. She is not diaphoretic.  Psychiatric: She has a normal mood and affect. Her behavior is normal.  Nursing note and vitals reviewed.       Assessment & Plan:   Problem List Items Addressed This Visit    None    Visit Diagnoses    Right sided abdominal pain    -  Primary   Concern for appendicitis versus renal stone   Relevant Orders   Urinalysis, Complete   CT RENAL STONE STUDY   CT Abdomen Pelvis W Contrast   Right-sided low back pain without sciatica       Relevant Orders   Urinalysis, Complete   CT RENAL STONE STUDY   CT Abdomen Pelvis W Contrast       Follow up plan: Return if symptoms worsen or fail to improve.  Counseling provided for all of the vaccine components Orders Placed This Encounter  Procedures  . CT RENAL STONE STUDY  . CT Abdomen Pelvis W Contrast  . Urinalysis, Complete    Caryl Pina, MD [facility] 03/31/2016, 2:50 PM

## 2016-04-08 ENCOUNTER — Other Ambulatory Visit: Payer: Self-pay | Admitting: *Deleted

## 2016-04-08 ENCOUNTER — Other Ambulatory Visit: Payer: Self-pay

## 2016-04-08 DIAGNOSIS — R109 Unspecified abdominal pain: Secondary | ICD-10-CM

## 2016-04-08 NOTE — Telephone Encounter (Signed)
Last seen christy 11/2015 - have nurse phone in if approved

## 2016-04-09 MED ORDER — DIAZEPAM 5 MG PO TABS: ORAL_TABLET | ORAL | 1 refills | Status: DC

## 2016-04-09 NOTE — Telephone Encounter (Signed)
rx called into pharmacy

## 2016-04-19 ENCOUNTER — Ambulatory Visit (HOSPITAL_COMMUNITY)
Admission: RE | Admit: 2016-04-19 | Discharge: 2016-04-19 | Disposition: A | Discharge status: Home / Self Care | Payer: 59 | Source: Ambulatory Visit | Attending: Family Medicine | Admitting: Family Medicine

## 2016-04-19 DIAGNOSIS — R109 Unspecified abdominal pain: Secondary | ICD-10-CM | POA: Diagnosis not present

## 2016-04-19 DIAGNOSIS — M545 Low back pain, unspecified: Secondary | ICD-10-CM

## 2016-04-19 MED ORDER — IOPAMIDOL (ISOVUE-300) INJECTION 61%
100.0000 mL | Freq: Once | INTRAVENOUS | Status: AC | PRN
  Administered 2016-04-19: 100 mL via INTRAVENOUS

## 2016-04-21 ENCOUNTER — Telehealth: Payer: Self-pay | Admitting: Family

## 2016-04-21 NOTE — Telephone Encounter (Signed)
Aware of results. 

## 2016-05-14 ENCOUNTER — Other Ambulatory Visit: Payer: Self-pay | Admitting: Family

## 2016-05-14 DIAGNOSIS — G43009 Migraine without aura, not intractable, without status migrainosus: Secondary | ICD-10-CM

## 2016-06-02 ENCOUNTER — Encounter: Payer: Self-pay | Admitting: Family

## 2016-06-02 ENCOUNTER — Ambulatory Visit (INDEPENDENT_AMBULATORY_CARE_PROVIDER_SITE_OTHER): Payer: 59 | Admitting: Family

## 2016-06-02 VITALS — BP 118/75 | HR 77 | Temp 97.3°F | Ht 67.5 in | Wt 138.0 lb

## 2016-06-02 DIAGNOSIS — J301 Allergic rhinitis due to pollen: Secondary | ICD-10-CM | POA: Diagnosis not present

## 2016-06-02 DIAGNOSIS — F411 Generalized anxiety disorder: Secondary | ICD-10-CM | POA: Diagnosis not present

## 2016-06-02 DIAGNOSIS — G43009 Migraine without aura, not intractable, without status migrainosus: Secondary | ICD-10-CM

## 2016-06-02 DIAGNOSIS — E559 Vitamin D deficiency, unspecified: Secondary | ICD-10-CM

## 2016-06-02 DIAGNOSIS — E782 Mixed hyperlipidemia: Secondary | ICD-10-CM | POA: Diagnosis not present

## 2016-06-02 NOTE — Patient Instructions (Signed)
Health Maintenance, Female Adopting a healthy lifestyle and getting preventive care can go a long way to promote health and wellness. Talk with your health care provider about what schedule of regular examinations is right for you. This is a good chance for you to check in with your provider about disease prevention and staying healthy. In between checkups, there are plenty of things you can do on your own. Experts have done a lot of research about which lifestyle changes and preventive measures are most likely to keep you healthy. Ask your health care provider for more information. WEIGHT AND DIET  Eat a healthy diet  Be sure to include plenty of vegetables, fruits, low-fat dairy products, and lean protein.  Do not eat a lot of foods high in solid fats, added sugars, or salt.  Get regular exercise. This is one of the most important things you can do for your health.  Most adults should exercise for at least 150 minutes each week. The exercise should increase your heart rate and make you sweat (moderate-intensity exercise).  Most adults should also do strengthening exercises at least twice a week. This is in addition to the moderate-intensity exercise.  Maintain a healthy weight  Body mass index (BMI) is a measurement that can be used to identify possible weight problems. It estimates body fat based on height and weight. Your health care provider can help determine your BMI and help you achieve or maintain a healthy weight.  For females 20 years of age and older:   A BMI below 18.5 is considered underweight.  A BMI of 18.5 to 24.9 is normal.  A BMI of 25 to 29.9 is considered overweight.  A BMI of 30 and above is considered obese.  Watch levels of cholesterol and blood lipids  You should start having your blood tested for lipids and cholesterol at 56 years of age, then have this test every 5 years.  You may need to have your cholesterol levels checked more often if:  Your lipid  or cholesterol levels are high.  You are older than 56 years of age.  You are at high risk for heart disease.  CANCER SCREENING   Lung Cancer  Lung cancer screening is recommended for adults 55-80 years old who are at high risk for lung cancer because of a history of smoking.  A yearly low-dose CT scan of the lungs is recommended for people who:  Currently smoke.  Have quit within the past 15 years.  Have at least a 30-pack-year history of smoking. A pack year is smoking an average of one pack of cigarettes a day for 1 year.  Yearly screening should continue until it has been 15 years since you quit.  Yearly screening should stop if you develop a health problem that would prevent you from having lung cancer treatment.  Breast Cancer  Practice breast self-awareness. This means understanding how your breasts normally appear and feel.  It also means doing regular breast self-exams. Let your health care provider know about any changes, no matter how small.  If you are in your 20s or 30s, you should have a clinical breast exam (CBE) by a health care provider every 1-3 years as part of a regular health exam.  If you are 40 or older, have a CBE every year. Also consider having a breast X-ray (mammogram) every year.  If you have a family history of breast cancer, talk to your health care provider about genetic screening.  If you   are at high risk for breast cancer, talk to your health care provider about having an MRI and a mammogram every year.  Breast cancer gene (BRCA) assessment is recommended for women who have family members with BRCA-related cancers. BRCA-related cancers include:  Breast.  Ovarian.  Tubal.  Peritoneal cancers.  Results of the assessment will determine the need for genetic counseling and BRCA1 and BRCA2 testing. Cervical Cancer Your health care provider may recommend that you be screened regularly for cancer of the pelvic organs (ovaries, uterus, and  vagina). This screening involves a pelvic examination, including checking for microscopic changes to the surface of your cervix (Pap test). You may be encouraged to have this screening done every 3 years, beginning at age 78.  For women ages 22-65, health care providers may recommend pelvic exams and Pap testing every 3 years, or they may recommend the Pap and pelvic exam, combined with testing for human papilloma virus (HPV), every 5 years. Some types of HPV increase your risk of cervical cancer. Testing for HPV may also be done on women of any age with unclear Pap test results.  Other health care providers may not recommend any screening for nonpregnant women who are considered low risk for pelvic cancer and who do not have symptoms. Ask your health care provider if a screening pelvic exam is right for you.  If you have had past treatment for cervical cancer or a condition that could lead to cancer, you need Pap tests and screening for cancer for at least 20 years after your treatment. If Pap tests have been discontinued, your risk factors (such as having a new sexual partner) need to be reassessed to determine if screening should resume. Some women have medical problems that increase the chance of getting cervical cancer. In these cases, your health care provider may recommend more frequent screening and Pap tests. Colorectal Cancer  This type of cancer can be detected and often prevented.  Routine colorectal cancer screening usually begins at 56 years of age and continues through 56 years of age.  Your health care provider may recommend screening at an earlier age if you have risk factors for colon cancer.  Your health care provider may also recommend using home test kits to check for hidden blood in the stool.  A small camera at the end of a tube can be used to examine your colon directly (sigmoidoscopy or colonoscopy). This is done to check for the earliest forms of colorectal  cancer.  Routine screening usually begins at age 29.  Direct examination of the colon should be repeated every 5-10 years through 56 years of age. However, you may need to be screened more often if early forms of precancerous polyps or small growths are found. Skin Cancer  Check your skin from head to toe regularly.  Tell your health care provider about any new moles or changes in moles, especially if there is a change in a mole's shape or color.  Also tell your health care provider if you have a mole that is larger than the size of a pencil eraser.  Always use sunscreen. Apply sunscreen liberally and repeatedly throughout the day.  Protect yourself by wearing long sleeves, pants, a wide-brimmed hat, and sunglasses whenever you are outside. HEART DISEASE, DIABETES, AND HIGH BLOOD PRESSURE   High blood pressure causes heart disease and increases the risk of stroke. High blood pressure is more likely to develop in:  People who have blood pressure in the high end  of the normal range (130-139/85-89 mm Hg).  People who are overweight or obese.  People who are African American.  If you are 39-23 years of age, have your blood pressure checked every 3-5 years. If you are 54 years of age or older, have your blood pressure checked every year. You should have your blood pressure measured twice--once when you are at a hospital or clinic, and once when you are not at a hospital or clinic. Record the average of the two measurements. To check your blood pressure when you are not at a hospital or clinic, you can use:  An automated blood pressure machine at a pharmacy.  A home blood pressure monitor.  If you are between 10 years and 55 years old, ask your health care provider if you should take aspirin to prevent strokes.  Have regular diabetes screenings. This involves taking a blood sample to check your fasting blood sugar level.  If you are at a normal weight and have a low risk for diabetes,  have this test once every three years after 56 years of age.  If you are overweight and have a high risk for diabetes, consider being tested at a younger age or more often. PREVENTING INFECTION  Hepatitis B  If you have a higher risk for hepatitis B, you should be screened for this virus. You are considered at high risk for hepatitis B if:  You were born in a country where hepatitis B is common. Ask your health care provider which countries are considered high risk.  Your parents were born in a high-risk country, and you have not been immunized against hepatitis B (hepatitis B vaccine).  You have HIV or AIDS.  You use needles to inject street drugs.  You live with someone who has hepatitis B.  You have had sex with someone who has hepatitis B.  You get hemodialysis treatment.  You take certain medicines for conditions, including cancer, organ transplantation, and autoimmune conditions. Hepatitis C  Blood testing is recommended for:  Everyone born from [birth year] through 1966.  Anyone with known risk factors for hepatitis C. Sexually transmitted infections (STIs)  You should be screened for sexually transmitted infections (STIs) including gonorrhea and chlamydia if:  You are sexually active and are younger than 56 years of age.  You are older than 56 years of age and your health care provider tells you that you are at risk for this type of infection.  Your sexual activity has changed since you were last screened and you are at an increased risk for chlamydia or gonorrhea. Ask your health care provider if you are at risk.  If you do not have HIV, but are at risk, it may be recommended that you take a prescription medicine daily to prevent HIV infection. This is called pre-exposure prophylaxis (PrEP). You are considered at risk if:  You are sexually active and do not regularly use condoms or know the HIV status of your partner(s).  You take drugs by injection.  You are sexually  active with a partner who has HIV. Talk with your health care provider about whether you are at high risk of being infected with HIV. If you choose to begin PrEP, you should first be tested for HIV. You should then be tested every 3 months for as long as you are taking PrEP.  PREGNANCY   If you are premenopausal and you may become pregnant, ask your health care provider about preconception counseling.  If you may  become pregnant, take 400 to 800 micrograms (mcg) of folic acid every day.  If you want to prevent pregnancy, talk to your health care provider about birth control (contraception). OSTEOPOROSIS AND MENOPAUSE   Osteoporosis is a disease in which the bones lose minerals and strength with aging. This can result in serious bone fractures. Your risk for osteoporosis can be identified using a bone density scan.  If you are 94 years of age or older, or if you are at risk for osteoporosis and fractures, ask your health care provider if you should be screened.  Ask your health care provider whether you should take a calcium or vitamin D supplement to lower your risk for osteoporosis.  Menopause may have certain physical symptoms and risks.  Hormone replacement therapy may reduce some of these symptoms and risks. Talk to your health care provider about whether hormone replacement therapy is right for you.  HOME CARE INSTRUCTIONS   Schedule regular health, dental, and eye exams.  Stay current with your immunizations.   Do not use any tobacco products including cigarettes, chewing tobacco, or electronic cigarettes.  If you are pregnant, do not drink alcohol.  If you are breastfeeding, limit how much and how often you drink alcohol.  Limit alcohol intake to no more than 1 drink per day for nonpregnant women. One drink equals 12 ounces of beer, 5 ounces of wine, or 1 ounces of hard liquor.  Do not use street drugs.  Do not share needles.  Ask your health care provider for help if  you need support or information about quitting drugs.  Tell your health care provider if you often feel depressed.  Tell your health care provider if you have ever been abused or do not feel safe at home.   This information is not intended to replace advice given to you by your health care provider. Make sure you discuss any questions you have with your health care provider.   Document Released: 02/08/2011 Document Revised: 08/16/2014 Document Reviewed: 06/27/2013 Elsevier Interactive Patient Education [employer].

## 2016-06-02 NOTE — Progress Notes (Signed)
Subjective:    Patient ID: Theresa Pham, female    DOB: Dec 21, 1960, 55 y.o.   MRN: [id number]  PT presents to the office today for chronic follow up. Pt has chronic migraines and had seen a  Neurologists in the past but is realeased at this time.  Medication Refill   Anxiety  Presents for follow-up visit. Symptoms include excessive worry and nervous/anxious behavior. Patient reports no depressed mood, irritability, palpitations, panic, restlessness or shortness of breath. Symptoms occur rarely. The severity of symptoms is mild. The quality of sleep is good.    Hyperlipidemia  This is a chronic problem. The current episode started more than 1 year ago. The problem is uncontrolled. Recent lipid tests were reviewed and are high. She has no history of diabetes or hypothyroidism. Factors aggravating her hyperlipidemia include smoking. Pertinent negatives include no leg pain or shortness of breath. Current antihyperlipidemic treatment includes diet change (Pt states she does not want to be on cholesterol medicatiions). The current treatment provides no improvement of lipids. Compliance problems include medication side effects.  Risk factors for coronary artery disease include dyslipidemia, post-menopausal and family history.  Migraine   This is a chronic problem. The current episode started more than 1 year ago. The problem occurs monthly. The problem has been waxing and waning. The pain is located in the left unilateral region. The pain quality is similar to prior headaches. The quality of the pain is described as aching. The pain is at a severity of 5/10. The pain is moderate. Associated symptoms include phonophobia and photophobia. Pertinent negatives include no eye redness or eye watering. The symptoms are aggravated by sneezing and exposure to cold air. She has tried NSAIDs and triptans for the symptoms. The treatment provided moderate relief. Her past medical history is significant for migraine  headaches.      Review of Systems  Constitutional: Negative.  Negative for irritability.  HENT: Negative.   Eyes: Positive for photophobia. Negative for redness.  Respiratory: Negative.  Negative for shortness of breath.   Cardiovascular: Negative.  Negative for palpitations.  Gastrointestinal: Negative.   Endocrine: Negative.   Genitourinary: Negative.   Musculoskeletal: Negative.   Neurological: Negative.   Hematological: Negative.   Psychiatric/Behavioral: The patient is nervous/anxious.   All other systems reviewed and are negative.      Objective:   Physical Exam  Constitutional: She is oriented to person, place, and time. She appears well-developed and well-nourished. No distress.  HENT:  Head: Normocephalic and atraumatic.  Right Ear: External ear normal.  Left Ear: External ear normal.  Nose: Nose normal.  Mouth/Throat: Oropharynx is clear and moist.  Eyes: Pupils are equal, round, and reactive to light.  Neck: Normal range of motion. Neck supple. No thyromegaly present.  Cardiovascular: Normal rate, regular rhythm, normal heart sounds and intact distal pulses.   No murmur heard. Pulmonary/Chest: Effort normal and breath sounds normal. No respiratory distress. She has no wheezes.  Abdominal: Soft. Bowel sounds are normal. She exhibits no distension. There is no tenderness.  Musculoskeletal: Normal range of motion. She exhibits no edema or tenderness.  Neurological: She is alert and oriented to person, place, and time.  Skin: Skin is warm and dry.  Psychiatric: She has a normal mood and affect. Her behavior is normal. Judgment and thought content normal.  Vitals reviewed.     BP 118/75   Pulse 77   Temp 97.3 F (36.3 C) (Oral)   Ht 5' 7.5" (1.715 m)   Subjective:    Patient ID: Theresa Pham, female    DOB: 09/02/1960, 55 y.o.   MRN: [id number]  PT presents to the office today for chronic follow up. Pt has chronic migraines and had seen a  Neurologists in the past but is realeased at this time.  Medication Refill   Anxiety  Presents for follow-up visit. Symptoms include excessive worry and nervous/anxious behavior. Patient reports no depressed mood, irritability, palpitations, panic, restlessness or shortness of breath. Symptoms occur rarely. The severity of symptoms is mild. The quality of sleep is good.    Hyperlipidemia  This is a chronic problem. The current episode started more than 1 year ago. The problem is uncontrolled. Recent lipid tests were reviewed and are high. She has no history of diabetes or hypothyroidism. Factors aggravating her hyperlipidemia include smoking. Pertinent negatives include no leg pain or shortness of breath. Current antihyperlipidemic treatment includes diet change (Pt states she does not want to be on cholesterol medicatiions). The current treatment provides no improvement of lipids. Compliance problems include medication side effects.  Risk factors for coronary artery disease include dyslipidemia, post-menopausal and family history.  Migraine   This is a chronic problem. The current episode started more than 1 year ago. The problem occurs monthly. The problem has been waxing and waning. The pain is located in the left unilateral region. The pain quality is similar to prior headaches. The quality of the pain is described as aching. The pain is at a severity of 5/10. The pain is moderate. Associated symptoms include phonophobia and photophobia. Pertinent negatives include no eye redness or eye watering. The symptoms are aggravated by sneezing and exposure to cold air. She has tried NSAIDs and triptans for the symptoms. The treatment provided moderate relief. Her past medical history is significant for migraine  headaches.      Review of Systems  Constitutional: Negative.  Negative for irritability.  HENT: Negative.   Eyes: Positive for photophobia. Negative for redness.  Respiratory: Negative.  Negative for shortness of breath.   Cardiovascular: Negative.  Negative for palpitations.  Gastrointestinal: Negative.   Endocrine: Negative.   Genitourinary: Negative.   Musculoskeletal: Negative.   Neurological: Negative.   Hematological: Negative.   Psychiatric/Behavioral: The patient is nervous/anxious.   All other systems reviewed and are negative.      Objective:   Physical Exam  Constitutional: She is oriented to person, place, and time. She appears well-developed and well-nourished. No distress.  HENT:  Head: Normocephalic and atraumatic.  Right Ear: External ear normal.  Left Ear: External ear normal.  Nose: Nose normal.  Mouth/Throat: Oropharynx is clear and moist.  Eyes: Pupils are equal, round, and reactive to light.  Neck: Normal range of motion. Neck supple. No thyromegaly present.  Cardiovascular: Normal rate, regular rhythm, normal heart sounds and intact distal pulses.   No murmur heard. Pulmonary/Chest: Effort normal and breath sounds normal. No respiratory distress. She has no wheezes.  Abdominal: Soft. Bowel sounds are normal. She exhibits no distension. There is no tenderness.  Musculoskeletal: Normal range of motion. She exhibits no edema or tenderness.  Neurological: She is alert and oriented to person, place, and time.  Skin: Skin is warm and dry.  Psychiatric: She has a normal mood and affect. Her behavior is normal. Judgment and thought content normal.  Vitals reviewed.     BP 118/75   Pulse 77   Temp 97.3 F (36.3 C) (Oral)   Ht 5' 7.5" (1.715 m)

## 2016-06-03 LAB — CMP14+EGFR
A/G RATIO: 1.7 (ref 1.2–2.2)
ALT: 8 IU/L (ref 0–32)
AST: 14 IU/L (ref 0–40)
Albumin: 4.2 g/dL (ref 3.5–5.5)
Alkaline Phosphatase: 89 IU/L (ref 39–117)
BUN/Creatinine Ratio: 11 (ref 9–23)
BUN: 7 mg/dL (ref 6–24)
Bilirubin Total: <0.2 mg/dL (ref 0.0–1.2)
CALCIUM: 9.7 mg/dL (ref 8.7–10.2)
CO2: 27 mmol/L (ref 18–29)
Chloride: 103 mmol/L (ref 96–106)
Creatinine, Ser: 0.65 mg/dL (ref 0.57–1.00)
GFR, EST AFRICAN AMERICAN: 115 mL/min/{1.73_m2} (ref >59)
GFR, EST NON AFRICAN AMERICAN: 100 mL/min/{1.73_m2} (ref >59)
GLOBULIN, TOTAL: 2.5 g/dL (ref 1.5–4.5)
Glucose: 74 mg/dL (ref 65–99)
POTASSIUM: 4.2 mmol/L (ref 3.5–5.2)
Sodium: 144 mmol/L (ref 134–144)
TOTAL PROTEIN: 6.7 g/dL (ref 6.0–8.5)

## 2016-06-07 ENCOUNTER — Other Ambulatory Visit: Payer: Self-pay

## 2016-06-07 MED ORDER — DIAZEPAM 5 MG PO TABS: ORAL_TABLET | ORAL | 2 refills | Status: DC

## 2016-06-17 ENCOUNTER — Ambulatory Visit (INDEPENDENT_AMBULATORY_CARE_PROVIDER_SITE_OTHER): Payer: 59 | Admitting: Family

## 2016-06-17 ENCOUNTER — Encounter: Payer: Self-pay | Admitting: Family

## 2016-06-17 VITALS — BP 134/86 | HR 88 | Temp 97.7°F | Ht 67.5 in | Wt 137.2 lb

## 2016-06-17 DIAGNOSIS — J019 Acute sinusitis, unspecified: Secondary | ICD-10-CM | POA: Diagnosis not present

## 2016-06-17 DIAGNOSIS — R42 Dizziness and giddiness: Secondary | ICD-10-CM | POA: Diagnosis not present

## 2016-06-17 MED ORDER — PREDNISONE 10 MG (21) PO TBPK: ORAL_TABLET | ORAL | 0 refills | Status: DC

## 2016-06-17 MED ORDER — MECLIZINE HCL 25 MG PO TABS: 25.0000 mg | ORAL_TABLET | Freq: Three times a day (TID) | ORAL | 0 refills | Status: DC | PRN

## 2016-06-17 MED ORDER — FLUTICASONE PROPIONATE 50 MCG/ACT NA SUSP: NASAL | 6 refills | Status: DC

## 2016-06-17 NOTE — Patient Instructions (Signed)

## 2016-06-17 NOTE — Progress Notes (Signed)
Subjective:    Patient ID: Theresa Pham, female    DOB: [birth year], 55 y.o.   MRN: [id number]  Dizziness  This is a new problem. The current episode started 1 to 4 weeks ago. The problem occurs constantly. The problem has been unchanged. Associated symptoms include congestion, coughing, headaches and swollen glands. Pertinent negatives include no chills or sore throat.  Sinus Problem  This is a new problem. The current episode started in the past 7 days. The problem is unchanged. There has been no fever. Her pain is at a severity of 3/10. The pain is mild. Associated symptoms include congestion, coughing, ear pain, headaches, a hoarse voice, sinus pressure, sneezing and swollen glands. Pertinent negatives include no chills or sore throat. Past treatments include lying down, acetaminophen and oral decongestants. The treatment provided mild relief.      Review of Systems  Constitutional: Negative for chills.  HENT: Positive for congestion, ear pain, hoarse voice, sinus pressure and sneezing. Negative for sore throat.   Respiratory: Positive for cough.   Neurological: Positive for dizziness and headaches.  All other systems reviewed and are negative.      Objective:   Physical Exam  Constitutional: She is oriented to person, place, and time. She appears well-developed and well-nourished. No distress.  HENT:  Head: Normocephalic and atraumatic.  Right Ear: External ear normal.  Left Ear: External ear normal.  Nose: Mucosal edema and rhinorrhea present. Right sinus exhibits maxillary sinus tenderness and frontal sinus tenderness. Left sinus exhibits maxillary sinus tenderness and frontal sinus tenderness.  Mouth/Throat: Posterior oropharyngeal erythema present.  Eyes: Pupils are equal, round, and reactive to light.  Neck: Normal range of motion. Neck supple. No thyromegaly present.  Cardiovascular: Normal rate, regular rhythm, normal heart sounds and intact distal pulses.   No  murmur heard. Pulmonary/Chest: Effort normal and breath sounds normal. No respiratory distress. She has no wheezes.  Abdominal: Soft. Bowel sounds are normal. She exhibits no distension. There is no tenderness.  Musculoskeletal: Normal range of motion. She exhibits no edema or tenderness.  Neurological: She is alert and oriented to person, place, and time. She has normal reflexes. No cranial nerve deficit.  Skin: Skin is warm and dry.  Psychiatric: She has a normal mood and affect. Her behavior is normal. Judgment and thought content normal.  Vitals reviewed.     BP 134/86   Pulse 88   Temp 97.7 F (36.5 C) (Oral)   Ht 5' 7.5" (1.715 m)   Wt 137 lb 3.2 oz (62.2 kg)   BMI 21.17 kg/m      Assessment & Plan:  1. Acute non-recurrent sinusitis, unspecified location -- Take meds as prescribed - Use a cool mist humidifier  -Use saline nose sprays frequently -Saline irrigations of the nose can be very helpful if done frequently.  * 4X daily for 1 week*  * Use of a nettie pot can be helpful with this. Follow directions with this* -Force fluids -For any cough or congestion  Use plain Mucinex- regular strength or max strength is fine   * Children- consult with Pharmacist for dosing -For fever or aces or pains- take tylenol or ibuprofen appropriate for age and weight.  * for fevers greater than 101 orally you may alternate ibuprofen and tylenol every  3 hours. -Throat lozenges if help - predniSONE (STERAPRED UNI-PAK 21 TAB) 10 MG (21) TBPK tablet; Use as directed  Dispense: 21 tablet; Refill: 0 - fluticasone (FLONASE) 50 MCG/ACT nasal spray;  One to 2 sprays each nostril at bedtime  Dispense: 16 g; Refill: 6  2. Acute rhinosinusitis - fluticasone (FLONASE) 50 MCG/ACT nasal spray; One to 2 sprays each nostril at bedtime  Dispense: 16 g; Refill: 6  3. Dizziness -Falls precautions discussed - meclizine (ANTIVERT) 25 MG tablet; Take 1 tablet (25 mg total) by mouth 3 (three) times daily as  needed for dizziness.  Dispense: 30 tablet; Refill: 0   Evelina Dun, FNP

## 2016-08-12 ENCOUNTER — Other Ambulatory Visit: Payer: Self-pay | Admitting: Family

## 2016-08-12 NOTE — Telephone Encounter (Signed)
Spoke to the pharmacy and advised that the last rx was a 30 day supply with 2 refills and that it was too early. They only had one refill so they took the verbal and corrected the original rx.

## 2016-08-24 ENCOUNTER — Other Ambulatory Visit: Payer: Self-pay | Admitting: Neurology

## 2016-08-31 ENCOUNTER — Other Ambulatory Visit: Payer: Self-pay | Admitting: Family

## 2016-08-31 DIAGNOSIS — G43009 Migraine without aura, not intractable, without status migrainosus: Secondary | ICD-10-CM

## 2016-09-03 ENCOUNTER — Encounter: Payer: Self-pay | Admitting: Family

## 2016-09-06 ENCOUNTER — Other Ambulatory Visit: Payer: Self-pay | Admitting: Family

## 2016-09-06 MED ORDER — VENLAFAXINE HCL ER 150 MG PO CP24: 150.0000 mg | ORAL_CAPSULE | Freq: Every day | ORAL | 1 refills | Status: DC

## 2016-09-13 ENCOUNTER — Other Ambulatory Visit: Payer: Self-pay | Admitting: Family

## 2016-09-14 NOTE — Telephone Encounter (Signed)
Refill called to [facility] 

## 2016-12-07 ENCOUNTER — Ambulatory Visit (INDEPENDENT_AMBULATORY_CARE_PROVIDER_SITE_OTHER): Payer: 59 | Admitting: Nurse Practitioner

## 2016-12-07 ENCOUNTER — Encounter: Payer: Self-pay | Admitting: Nurse Practitioner

## 2016-12-07 VITALS — BP 131/85 | HR 87 | Temp 98.8°F | Ht 67.5 in | Wt 141.0 lb

## 2016-12-07 DIAGNOSIS — J0100 Acute maxillary sinusitis, unspecified: Secondary | ICD-10-CM

## 2016-12-07 MED ORDER — DOXYCYCLINE MONOHYDRATE 100 MG PO CAPS: 100.0000 mg | ORAL_CAPSULE | Freq: Two times a day (BID) | ORAL | 0 refills | Status: AC

## 2016-12-07 NOTE — Patient Instructions (Addendum)

## 2016-12-07 NOTE — Progress Notes (Addendum)
Subjective:     Theresa Pham is a 56 y.o. female who presents for evaluation of sinus pain. Symptoms include: clear rhinorrhea, congestion, facial pain, foul breath, headaches, nasal congestion, post nasal drip and sore throat. Onset of symptoms was 3 days ago. Symptoms have been gradually worsening since that time. Past history is significant for no history of pneumonia or bronchitis. Patient is a non-smoker. * husband- last fridayd was diagnosed with flu The following portions of the patient's history were reviewed and updated as appropriate: allergies, current medications, past family history, past medical history, past social history, past surgical history and problem list.  Review of Systems Pertinent items noted in HPI and remainder of comprehensive ROS otherwise negative.   Objective:    BP 131/85   Pulse 87   Temp 98.8 F (37.1 C) (Oral)   Ht 5' 7.5" (1.715 m)   Wt 141 lb (64 kg)   BMI 21.76 kg/m  General appearance: alert and cooperative Eyes: conjunctivae/corneas clear. PERRL, EOM's intact. Fundi benign. Ears: normal TM's and external ear canals both ears Nose: clear discharge, moderate congestion, turbinates red, sinus tenderness bilateral Throat: lips, mucosa, and tongue normal; teeth and gums normal Neck: no adenopathy, no carotid bruit, no JVD, supple, symmetrical, trachea midline and thyroid not enlarged, symmetric, no tenderness/mass/nodules Lungs: clear to auscultation bilaterally Heart: regular rate and rhythm, S1, S2 normal, no murmur, click, rub or gallop    Assessment:    Acute bacterial sinusitis.    Plan:     1. Take meds as prescribed 2. Use a cool mist humidifier especially during the winter months and when heat has been humid. 3. Use saline nose sprays frequently 4. Saline irrigations of the nose can be very helpful if done frequently.  * 4X daily for 1 week*  * Use of a nettie pot can be helpful with this. Follow directions with this* 5. Drink  plenty of fluids 6. Keep thermostat turn down low 7.For any cough or congestion  Use plain Mucinex- regular strength or max strength is fine   * Children- consult with Pharmacist for dosing 8. For fever or aces or pains- take tylenol or ibuprofen appropriate for age and weight.  * for fevers greater than 101 orally you may alternate ibuprofen and tylenol every  3 hours.     Meds ordered this encounter  Medications  . doxycycline (MONODOX) 100 MG capsule    Sig: Take 1 capsule (100 mg total) by mouth 2 (two) times daily.    Dispense:  14 capsule    Refill:  0    Order Specific Question:   Supervising Provider    Answer:   Eustaquio Maize [place], FNP

## 2016-12-22 ENCOUNTER — Other Ambulatory Visit: Payer: Self-pay | Admitting: Family

## 2016-12-23 NOTE — Telephone Encounter (Signed)
Patient NTBS for follow up and lab work  

## 2017-01-17 ENCOUNTER — Telehealth: Payer: Self-pay | Admitting: *Deleted

## 2017-01-17 ENCOUNTER — Other Ambulatory Visit: Payer: Self-pay | Admitting: Family

## 2017-01-17 NOTE — Telephone Encounter (Signed)
Valium refill called to [facility].

## 2017-02-10 ENCOUNTER — Other Ambulatory Visit: Payer: Self-pay | Admitting: Family

## 2017-02-10 DIAGNOSIS — G43009 Migraine without aura, not intractable, without status migrainosus: Secondary | ICD-10-CM

## 2017-02-13 IMAGING — CT CT ABD-PELV W/ CM
2 of 5 series · 16 of 46 positions shown, 18 images · IV contrast (iopamidol)
Comparison: 05/30/2013 abdominal ultrasound.

CLINICAL DATA: 55 y/o F; right lower quadrant pain for 3 weeks.
Concern for acute appendicitis. History of cholecystectomy.

EXAM:
CT ABDOMEN AND PELVIS WITH CONTRAST
TECHNIQUE: Multidetector CT imaging of the abdomen and pelvis was performed
using the standard protocol following bolus administration of
intravenous contrast.
CONTRAST:  100mL D1NSEU-UZZ IOPAMIDOL (D1NSEU-UZZ) INJECTION 61%

[Series 2: routine abd pel with · axial · 0.64mm/px · z∈[-604,-190]mm · 13 of 95 slices shown, 15 images]
[im 6/95  soft-tissue]
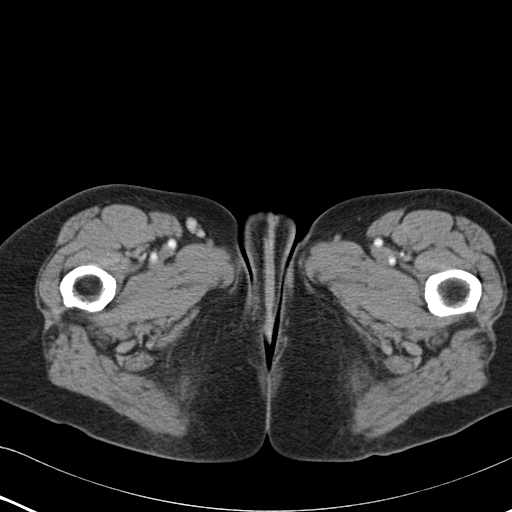
[im 6/95  bone]
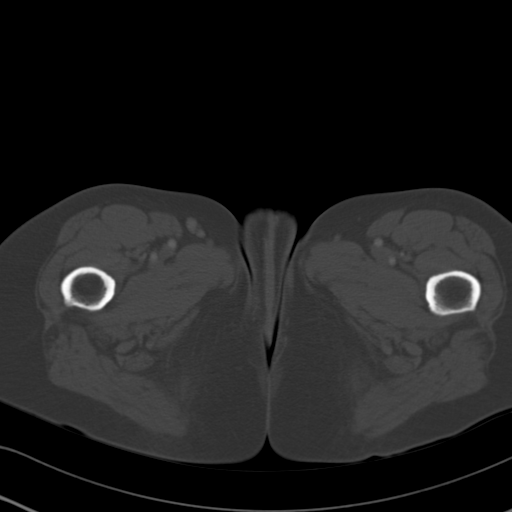
[im 11/95  soft-tissue]
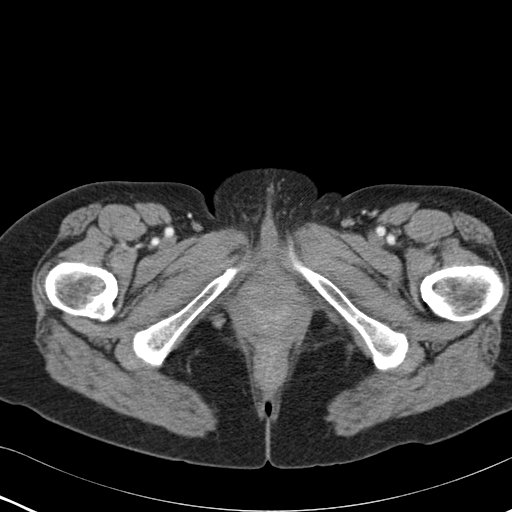
[im 21/95  soft-tissue]
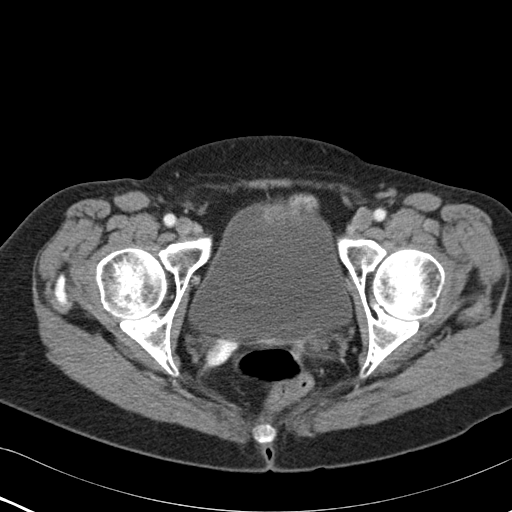
[im 27/95  soft-tissue]
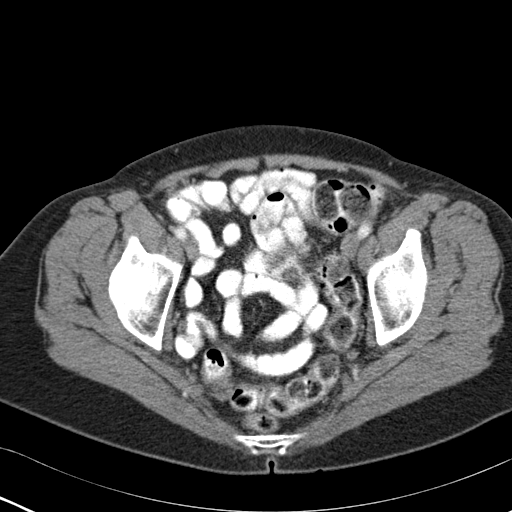
[im 32/95  soft-tissue]
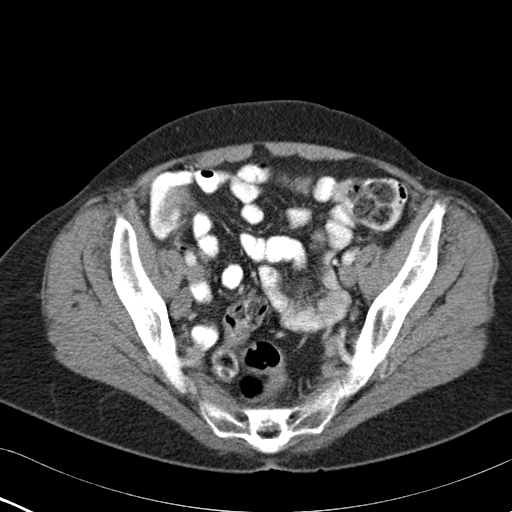
[im 42/95  soft-tissue]
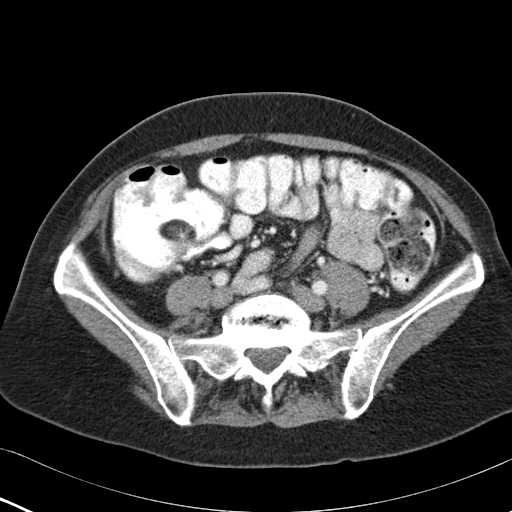
[im 48/95  soft-tissue]
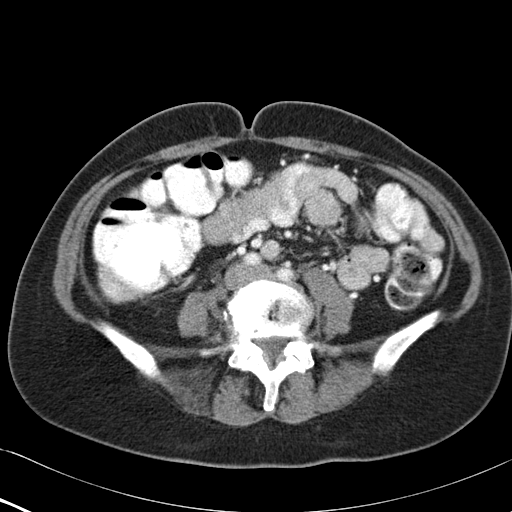
[im 53/95  soft-tissue]
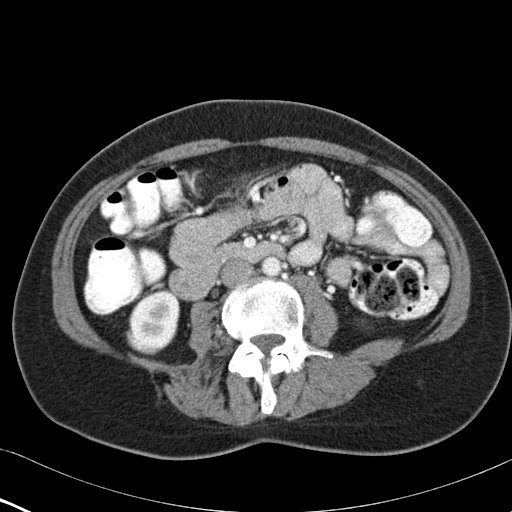
[im 63/95  soft-tissue]
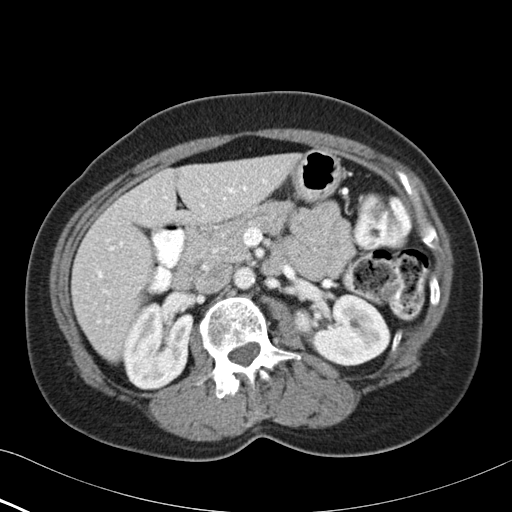
[im 63/95  bone]
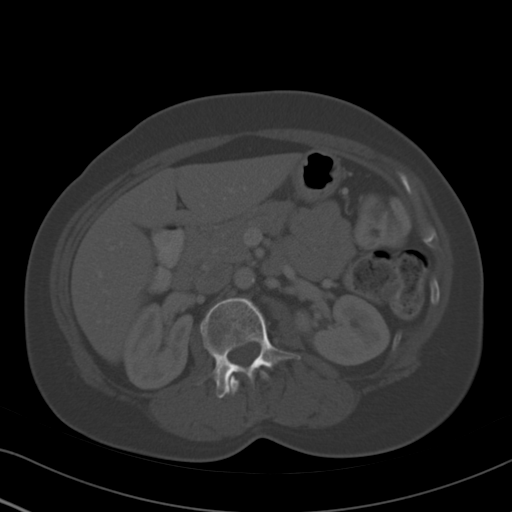
[im 68/95  soft-tissue]
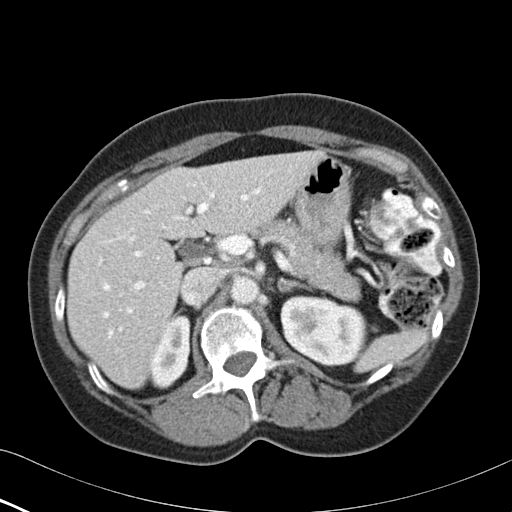
[im 74/95  soft-tissue]
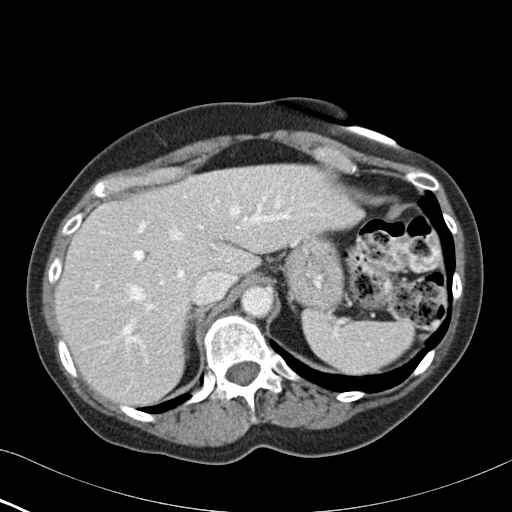
[im 84/95  soft-tissue]
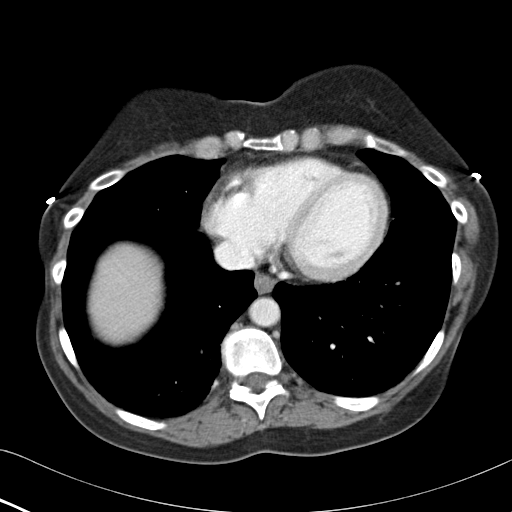
[im 89/95  soft-tissue]
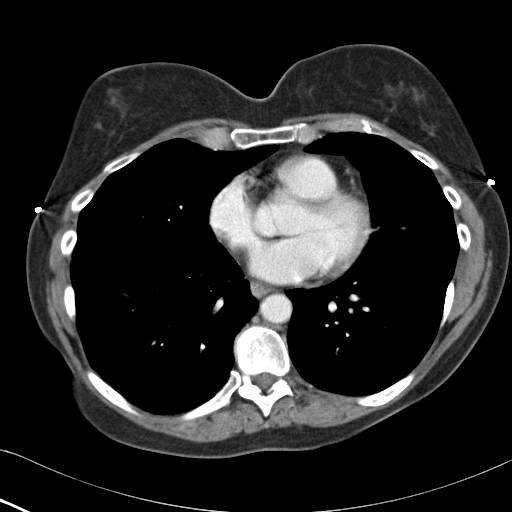

[Series 3: coronal · coronal · 0.67mm/px · 3 of 133 slices shown]
[im 45/133  soft-tissue]
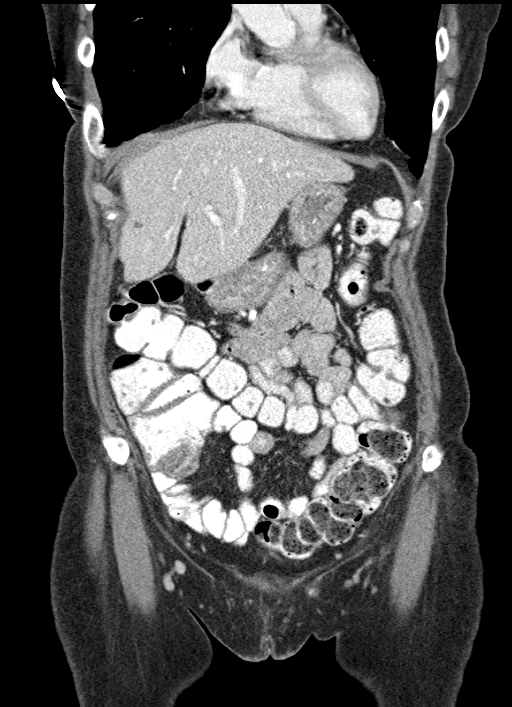
[im 59/133  soft-tissue]
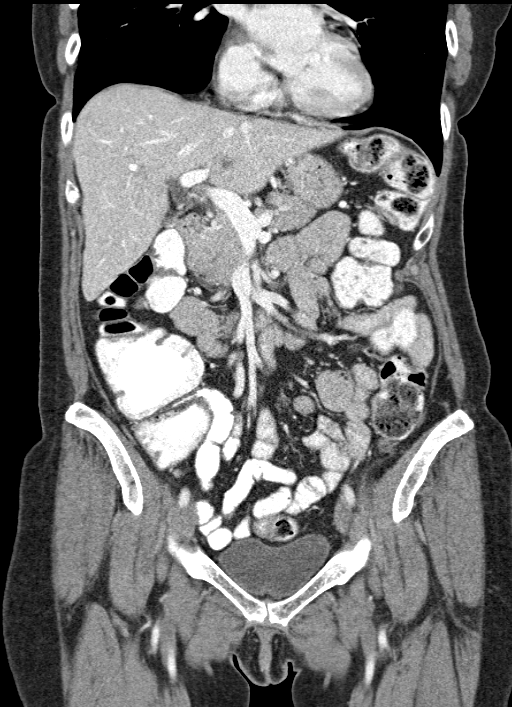
[im 74/133  soft-tissue]
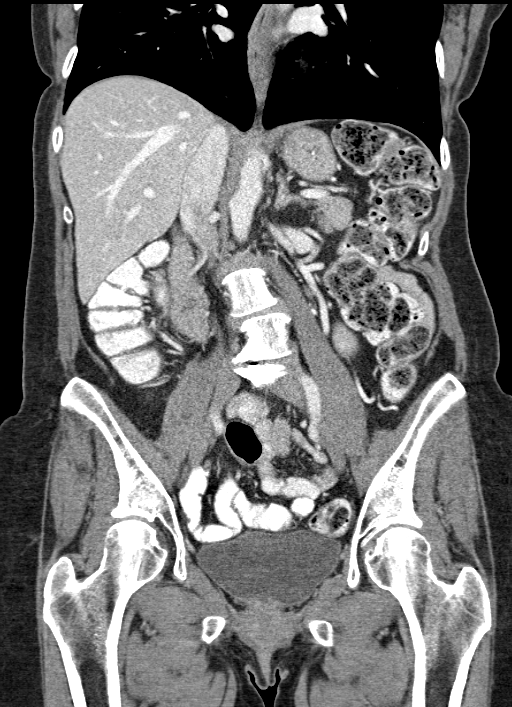

[16 of 46 positions shown; findings below may reference images not displayed]

FINDINGS: Lower chest: No acute abnormality.

Hepatobiliary: Status post cholecystectomy. No biliary dilatation.
4mm liver segment III and IVb nonspecific lucencies, probably cysts.
The liver is otherwise unremarkable.

Pancreas: Unremarkable. No pancreatic ductal dilatation or
surrounding inflammatory changes.

Spleen: Normal in size without focal abnormality.

Adrenals/Urinary Tract: Adrenal glands are unremarkable. Kidneys are
normal, without renal calculi, focal lesion, or hydronephrosis.
Bladder is unremarkable.

Stomach/Bowel: Stomach is within normal limits. No evidence of bowel
wall thickening, distention, or inflammatory changes. Appendix
appears normal. Contrast extends through sigmoid colon.

Vascular/Lymphatic: No significant vascular findings are present. No
enlarged abdominal or pelvic lymph nodes.

Reproductive: Status post hysterectomy. No adnexal masses.

Other: No abdominal wall hernia or abnormality. No abdominopelvic
ascites.

Musculoskeletal: Moderate S-shaped curvature of the lumbar spine and
degenerative changes most pronounced at L4-5 and L5-S1. No acute
bony abnormality.
IMPRESSION: No acute abnormality of abdomen or pelvis as explanation for
abdominal pain. Normal appendix.

By: Oksana Almazan M.D.

## 2017-03-09 ENCOUNTER — Other Ambulatory Visit: Payer: Self-pay | Admitting: Family

## 2017-03-09 DIAGNOSIS — R42 Dizziness and giddiness: Secondary | ICD-10-CM

## 2017-03-09 MED ORDER — VENLAFAXINE HCL ER 150 MG PO CP24: 150.0000 mg | ORAL_CAPSULE | Freq: Every day | ORAL | 0 refills | Status: DC

## 2017-03-09 MED ORDER — MECLIZINE HCL 25 MG PO TABS: 25.0000 mg | ORAL_TABLET | Freq: Three times a day (TID) | ORAL | 0 refills | Status: DC | PRN

## 2017-03-10 NOTE — Telephone Encounter (Signed)
Script called to [facility] with message, NTBS for further refills.

## 2017-03-10 NOTE — Telephone Encounter (Signed)
Patient NTBS for follow up and lab work. Please call in Valium for pt

## 2017-04-19 ENCOUNTER — Other Ambulatory Visit: Payer: Self-pay | Admitting: Family

## 2017-04-20 NOTE — Telephone Encounter (Signed)
Patient NTBS for follow up and lab work  

## 2017-04-20 NOTE — Telephone Encounter (Signed)
Apt made

## 2017-04-25 ENCOUNTER — Encounter: Payer: Self-pay | Admitting: Family

## 2017-04-25 ENCOUNTER — Ambulatory Visit (INDEPENDENT_AMBULATORY_CARE_PROVIDER_SITE_OTHER): Payer: 59 | Admitting: Family

## 2017-04-25 VITALS — BP 123/85 | HR 105 | Temp 97.8°F | Ht 67.5 in | Wt 142.0 lb

## 2017-04-25 DIAGNOSIS — G43009 Migraine without aura, not intractable, without status migrainosus: Secondary | ICD-10-CM

## 2017-04-25 DIAGNOSIS — E559 Vitamin D deficiency, unspecified: Secondary | ICD-10-CM | POA: Diagnosis not present

## 2017-04-25 DIAGNOSIS — J301 Allergic rhinitis due to pollen: Secondary | ICD-10-CM | POA: Diagnosis not present

## 2017-04-25 DIAGNOSIS — F411 Generalized anxiety disorder: Secondary | ICD-10-CM | POA: Diagnosis not present

## 2017-04-25 DIAGNOSIS — E782 Mixed hyperlipidemia: Secondary | ICD-10-CM

## 2017-04-25 DIAGNOSIS — E876 Hypokalemia: Secondary | ICD-10-CM

## 2017-04-25 MED ORDER — VENLAFAXINE HCL ER 150 MG PO CP24: ORAL_CAPSULE | ORAL | 2 refills | Status: DC

## 2017-04-25 MED ORDER — DIAZEPAM 5 MG PO TABS: ORAL_TABLET | ORAL | 4 refills | Status: DC

## 2017-04-25 MED ORDER — SUMATRIPTAN SUCCINATE 100 MG PO TABS: ORAL_TABLET | ORAL | 4 refills | Status: DC

## 2017-04-25 NOTE — Patient Instructions (Signed)
Migraine Headache A migraine headache is an intense, throbbing pain on one side or both sides of the head. Migraines may also cause other symptoms, such as nausea, vomiting, and sensitivity to light and noise. What are the causes? Doing or taking certain things may also trigger migraines, such as:  Alcohol.  Smoking.  Medicines, such as: ? Medicine used to treat chest pain (nitroglycerine). ? Birth control pills. ? Estrogen pills. ? Certain blood pressure medicines.  Aged cheeses, chocolate, or caffeine.  Foods or drinks that contain nitrates, glutamate, aspartame, or tyramine.  Physical activity.  Other things that may trigger a migraine include:  Menstruation.  Pregnancy.  Hunger.  Stress, lack of sleep, too much sleep, or fatigue.  Weather changes.  What increases the risk? The following factors may make you more likely to experience migraine headaches:  Age. Risk increases with age.  Family history of migraine headaches.  Being Caucasian.  Depression and anxiety.  Obesity.  Being a woman.  Having a hole in the heart (patent foramen ovale) or other heart problems.  What are the signs or symptoms? The main symptom of this condition is pulsating or throbbing pain. Pain may:  Happen in any area of the head, such as on one side or both sides.  Interfere with daily activities.  Get worse with physical activity.  Get worse with exposure to bright lights or loud noises.  Other symptoms may include:  Nausea.  Vomiting.  Dizziness.  General sensitivity to bright lights, loud noises, or smells.  Before you get a migraine, you may get warning signs that a migraine is developing (aura). An aura may include:  Seeing flashing lights or having blind spots.  Seeing bright spots, halos, or zigzag lines.  Having tunnel vision or blurred vision.  Having numbness or a tingling feeling.  Having trouble talking.  Having muscle weakness.  How is this  diagnosed? A migraine headache can be diagnosed based on:  Your symptoms.  A physical exam.  Tests, such as CT scan or MRI of the head. These imaging tests can help rule out other causes of headaches.  Taking fluid from the spine (lumbar puncture) and analyzing it (cerebrospinal fluid analysis, or CSF analysis).  How is this treated? A migraine headache is usually treated with medicines that:  Relieve pain.  Relieve nausea.  Prevent migraines from coming back.  Treatment may also include:  Acupuncture.  Lifestyle changes like avoiding foods that trigger migraines.  Follow these instructions at home: Medicines  Take over-the-counter and prescription medicines only as told by your health care provider.  Do not drive or use heavy machinery while taking prescription pain medicine.  To prevent or treat constipation while you are taking prescription pain medicine, your health care provider may recommend that you: ? Drink enough fluid to keep your urine clear or pale yellow. ? Take over-the-counter or prescription medicines. ? Eat foods that are high in fiber, such as fresh fruits and vegetables, whole grains, and beans. ? Limit foods that are high in fat and processed sugars, such as fried and sweet foods. Lifestyle  Avoid alcohol use.  Do not use any products that contain nicotine or tobacco, such as cigarettes and e-cigarettes. If you need help quitting, ask your health care provider.  Get at least 8 hours of sleep every night.  Limit your stress. General instructions   Keep a journal to find out what may trigger your migraine headaches. For example, write down: ? What you eat and   drink. ? How much sleep you get. ? Any change to your diet or medicines.  If you have a migraine: ? Avoid things that make your symptoms worse, such as bright lights. ? It may help to lie down in a dark, quiet room. ? Do not drive or use heavy machinery. ? Ask your health care provider  what activities are safe for you while you are experiencing symptoms.  Keep all follow-up visits as told by your health care provider. This is important. Contact a health care provider if:  You develop symptoms that are different or more severe than your usual migraine symptoms. Get help right away if:  Your migraine becomes severe.  You have a fever.  You have a stiff neck.  You have vision loss.  Your muscles feel weak or like you cannot control them.  You start to lose your balance often.  You develop trouble walking.  You faint. This information is not intended to replace advice given to you by your health care provider. Make sure you discuss any questions you have with your health care provider. Document Released: 07/26/2005 Document Revised: 02/13/2016 Document Reviewed: 01/12/2016 [employer] Interactive Patient Education  2017 [employer]   

## 2017-04-25 NOTE — Progress Notes (Signed)
Subjective:    Patient ID: Theresa Pham, female    DOB: [birth year], 56 y.o.   MRN: [id number]  PT presents to the office today for chronic follow up. Pt has chronic migraines and had seen a  Neurologists in the past but is realeased at this time.  Anxiety  Presents for follow-up visit. Symptoms include depressed mood, excessive worry, irritability and nervous/anxious behavior. Patient reports no nausea or panic. Symptoms occur occasionally. The severity of symptoms is moderate. The quality of sleep is good.    Hyperlipidemia  This is a chronic problem. The current episode started more than 1 year ago. The problem is uncontrolled. Recent lipid tests were reviewed and are high. There are no known factors aggravating her hyperlipidemia. Current antihyperlipidemic treatment includes diet change. The current treatment provides no improvement of lipids. Risk factors for coronary artery disease include dyslipidemia, post-menopausal, a sedentary lifestyle and family history.  Migraine   This is a chronic problem. The current episode started more than 1 year ago. Episode frequency: Weekly. The problem has been unchanged. The pain is located in the left unilateral region. The pain does not radiate. The pain quality is similar to prior headaches. The pain is at a severity of 5/10. The pain is moderate. Associated symptoms include phonophobia and photophobia. Pertinent negatives include no back pain, blurred vision, ear pain, eye pain, nausea or vomiting. The symptoms are aggravated by weather changes. She has tried Excedrin and NSAIDs for the symptoms. The treatment provided mild relief.  Hypokalemia Takes K+ at times when "my feet and hands cramp".   Review of Systems  Constitutional: Positive for irritability.  HENT: Negative for ear pain.   Eyes: Positive for photophobia. Negative for blurred vision and pain.  Gastrointestinal: Negative for nausea and vomiting.  Musculoskeletal: Negative for back  pain.  Psychiatric/Behavioral: The patient is nervous/anxious.   All other systems reviewed and are negative.      Objective:   Physical Exam  Constitutional: She is oriented to person, place, and time. She appears well-developed and well-nourished. No distress.  HENT:  Head: Normocephalic and atraumatic.  Right Ear: External ear normal.  Left Ear: External ear normal.  Nose: Nose normal.  Mouth/Throat: Oropharynx is clear and moist.  Eyes: Pupils are equal, round, and reactive to light.  Neck: Normal range of motion. Neck supple. No thyromegaly present.  Cardiovascular: Normal rate, regular rhythm, normal heart sounds and intact distal pulses.   No murmur heard. Pulmonary/Chest: Effort normal and breath sounds normal. No respiratory distress. She has no wheezes.  Abdominal: Soft. Bowel sounds are normal. She exhibits no distension. There is no tenderness.  Musculoskeletal: Normal range of motion. She exhibits no edema or tenderness.  Neurological: She is alert and oriented to person, place, and time.  Skin: Skin is warm and dry.  Psychiatric: She has a normal mood and affect. Her behavior is normal. Judgment and thought content normal.  Vitals reviewed.    BP 123/85   Pulse (!) 105   Temp 97.8 F (36.6 C) (Oral)   Ht 5' 7.5" (1.715 m)   Wt 142 lb (64.4 kg)   BMI 21.91 kg/m      Assessment & Plan:  1. Generalized anxiety disorder - CMP14+EGFR - diazepam (VALIUM) 5 MG tablet; TAKE 1/2 TO 1 TABLET EVERY 8 HOURS AS NEEDED  Dispense: 90 tablet; Refill: 4 - venlafaxine XR (EFFEXOR-XR) 150 MG 24 hr capsule; TAKE 1 CAPSULE EVERY MORNING WITH BREAKFAST  Dispense: 90 capsule;  Refill: 2  2. Mixed hyperlipidemia - CMP14+EGFR - Lipid panel  3. Vitamin D deficiency - CMP14+EGFR - VITAMIN D 25 Hydroxy (Vit-D Deficiency, Fractures)  4. Allergic rhinitis due to pollen, unspecified seasonality - CMP14+EGFR  5. Migraine without aura and without status migrainosus, not  intractable - CMP14+EGFR - SUMAtriptan (IMITREX) 100 MG tablet; TAKE 1 TABLET AT ONSET OF MIGRAINE HEADACHE MAY REPEAT ONCE IN 2 HOURS (MAX OF 2 TABLETS/24 HOURS)  Dispense: 20 tablet; Refill: 4  6. Hypokalemia - CMP14+EGFR   Continue all meds Labs pending Health Maintenance reviewed Diet and exercise encouraged RTO 6 months   Evelina Dun, FNP

## 2017-04-26 ENCOUNTER — Other Ambulatory Visit: Payer: Self-pay | Admitting: Family

## 2017-04-26 LAB — LIPID PANEL
CHOLESTEROL TOTAL: 238 mg/dL — AB (ref 100–199)
Chol/HDL Ratio: 5.8 ratio — ABNORMAL HIGH (ref 0.0–4.4)
HDL: 41 mg/dL (ref >39)
LDL Calculated: 168 mg/dL — ABNORMAL HIGH (ref 0–99)
Triglycerides: 145 mg/dL (ref 0–149)
VLDL Cholesterol Cal: 29 mg/dL (ref 5–40)

## 2017-04-26 LAB — CMP14+EGFR
A/G RATIO: 1.9 (ref 1.2–2.2)
ALK PHOS: 82 IU/L (ref 39–117)
ALT: 9 IU/L (ref 0–32)
AST: 13 IU/L (ref 0–40)
Albumin: 4.5 g/dL (ref 3.5–5.5)
BUN/Creatinine Ratio: 15 (ref 9–23)
BUN: 12 mg/dL (ref 6–24)
CHLORIDE: 105 mmol/L (ref 96–106)
CO2: 23 mmol/L (ref 20–29)
Calcium: 9.7 mg/dL (ref 8.7–10.2)
Creatinine, Ser: 0.78 mg/dL (ref 0.57–1.00)
GFR calc non Af Amer: 85 mL/min/{1.73_m2} (ref >59)
GFR, EST AFRICAN AMERICAN: 98 mL/min/{1.73_m2} (ref >59)
Globulin, Total: 2.4 g/dL (ref 1.5–4.5)
Glucose: 84 mg/dL (ref 65–99)
POTASSIUM: 4.1 mmol/L (ref 3.5–5.2)
Sodium: 144 mmol/L (ref 134–144)
TOTAL PROTEIN: 6.9 g/dL (ref 6.0–8.5)

## 2017-04-26 LAB — VITAMIN D 25 HYDROXY (VIT D DEFICIENCY, FRACTURES): VIT D 25 HYDROXY: 16.9 ng/mL — AB (ref 30.0–100.0)

## 2017-04-26 MED ORDER — VITAMIN D (ERGOCALCIFEROL) 1.25 MG (50000 UNIT) PO CAPS: 50000.0000 [IU] | ORAL_CAPSULE | ORAL | 3 refills | Status: DC

## 2017-04-26 MED ORDER — ROSUVASTATIN CALCIUM 20 MG PO TABS: 20.0000 mg | ORAL_TABLET | Freq: Every day | ORAL | 1 refills | Status: DC

## 2017-09-01 ENCOUNTER — Other Ambulatory Visit: Payer: Self-pay | Admitting: Family

## 2017-09-01 DIAGNOSIS — G43009 Migraine without aura, not intractable, without status migrainosus: Secondary | ICD-10-CM

## 2017-09-16 ENCOUNTER — Other Ambulatory Visit: Payer: Self-pay | Admitting: Family

## 2017-09-16 DIAGNOSIS — F411 Generalized anxiety disorder: Secondary | ICD-10-CM

## 2017-09-19 NOTE — Telephone Encounter (Signed)
Last seen 04/25/17   [facility]

## 2017-10-21 ENCOUNTER — Other Ambulatory Visit: Payer: Self-pay | Admitting: Family Medicine

## 2017-10-21 DIAGNOSIS — F411 Generalized anxiety disorder: Secondary | ICD-10-CM

## 2017-10-24 ENCOUNTER — Ambulatory Visit: Payer: 59 | Admitting: Family

## 2017-10-24 ENCOUNTER — Encounter: Payer: Self-pay | Admitting: Family

## 2017-10-24 VITALS — BP 122/83 | HR 93 | Temp 98.3°F | Ht 67.5 in | Wt 142.6 lb

## 2017-10-24 DIAGNOSIS — E782 Mixed hyperlipidemia: Secondary | ICD-10-CM

## 2017-10-24 DIAGNOSIS — E559 Vitamin D deficiency, unspecified: Secondary | ICD-10-CM | POA: Diagnosis not present

## 2017-10-24 DIAGNOSIS — G43009 Migraine without aura, not intractable, without status migrainosus: Secondary | ICD-10-CM | POA: Diagnosis not present

## 2017-10-24 DIAGNOSIS — E876 Hypokalemia: Secondary | ICD-10-CM

## 2017-10-24 DIAGNOSIS — F411 Generalized anxiety disorder: Secondary | ICD-10-CM

## 2017-10-24 LAB — CMP14+EGFR
ALBUMIN: 4.3 g/dL (ref 3.5–5.5)
ALK PHOS: 96 IU/L (ref 39–117)
ALT: 8 IU/L (ref 0–32)
AST: 13 IU/L (ref 0–40)
Albumin/Globulin Ratio: 1.8 (ref 1.2–2.2)
BUN / CREAT RATIO: 9 (ref 9–23)
BUN: 7 mg/dL (ref 6–24)
Bilirubin Total: 0.2 mg/dL (ref 0.0–1.2)
CALCIUM: 9.7 mg/dL (ref 8.7–10.2)
CO2: 24 mmol/L (ref 20–29)
CREATININE: 0.75 mg/dL (ref 0.57–1.00)
Chloride: 104 mmol/L (ref 96–106)
GFR, EST AFRICAN AMERICAN: 102 mL/min/{1.73_m2} (ref >59)
GFR, EST NON AFRICAN AMERICAN: 88 mL/min/{1.73_m2} (ref >59)
GLOBULIN, TOTAL: 2.4 g/dL (ref 1.5–4.5)
Glucose: 79 mg/dL (ref 65–99)
Potassium: 4.4 mmol/L (ref 3.5–5.2)
SODIUM: 143 mmol/L (ref 134–144)
TOTAL PROTEIN: 6.7 g/dL (ref 6.0–8.5)

## 2017-10-24 LAB — LIPID PANEL
CHOL/HDL RATIO: 6.3 ratio — AB (ref 0.0–4.4)
Cholesterol, Total: 209 mg/dL — ABNORMAL HIGH (ref 100–199)
HDL: 33 mg/dL — ABNORMAL LOW (ref >39)
LDL CALC: 140 mg/dL — AB (ref 0–99)
Triglycerides: 180 mg/dL — ABNORMAL HIGH (ref 0–149)
VLDL CHOLESTEROL CAL: 36 mg/dL (ref 5–40)

## 2017-10-24 MED ORDER — DIAZEPAM 5 MG PO TABS: ORAL_TABLET | ORAL | 4 refills | Status: DC

## 2017-10-24 NOTE — Patient Instructions (Signed)

## 2017-10-24 NOTE — Progress Notes (Signed)
   Subjective:    Patient ID: Theresa Pham, female    DOB: 1960-09-04, 57 y.o.   MRN: [id number]  PT presents to the office today for chronic follow up. Pt has chronic migraines and had seen a Neurologists in the past but is realeased at this time.  Anxiety  Presents for follow-up visit. Symptoms include excessive worry, irritability, nervous/anxious behavior and restlessness. Patient reports no decreased concentration or depressed mood. Symptoms occur occasionally. The severity of symptoms is moderate. The quality of sleep is good.    Hyperlipidemia  This is a chronic problem. The current episode started more than 1 year ago. The problem is uncontrolled. Recent lipid tests were reviewed and are high. Current antihyperlipidemic treatment includes diet change (pt unable to tolerate Crestor). The current treatment provides no improvement of lipids. Compliance problems include medication side effects.  Risk factors for coronary artery disease include dyslipidemia and family history.  Migraine   This is a chronic problem. Episode onset: 3-4 times a week. The problem occurs intermittently. The problem has been waxing and waning. She has tried Excedrin for the symptoms. The treatment provided mild relief. Her past medical history is significant for migraine headaches.  Hypokalemia PT taking potassium PO. Stable    Review of Systems  Constitutional: Positive for irritability.  Psychiatric/Behavioral: Negative for decreased concentration. The patient is nervous/anxious.   All other systems reviewed and are negative.      Objective:   Physical Exam  Constitutional: She is oriented to person, place, and time. She appears well-developed and well-nourished. No distress.  HENT:  Head: Normocephalic and atraumatic.  Right Ear: External ear normal.  Left Ear: External ear normal.  Nose: Nose normal.  Mouth/Throat: Oropharynx is clear and moist.  Eyes: Pupils are equal, round, and reactive to  light.  Neck: Normal range of motion. Neck supple. No thyromegaly present.  Cardiovascular: Normal rate, regular rhythm, normal heart sounds and intact distal pulses.  No murmur heard. Pulmonary/Chest: Effort normal and breath sounds normal. No respiratory distress. She has no wheezes.  Abdominal: Soft. Bowel sounds are normal. She exhibits no distension. There is no tenderness.  Musculoskeletal: Normal range of motion. She exhibits no edema or tenderness.  Neurological: She is alert and oriented to person, place, and time.  Skin: Skin is warm and dry.  Psychiatric: She has a normal mood and affect. Her behavior is normal. Judgment and thought content normal.  Vitals reviewed.     BP 122/83   Pulse 93   Temp 98.3 F (36.8 C) (Oral)   Ht 5' 7.5" (1.715 m)   Wt 142 lb 9.6 oz (64.7 kg)   BMI 22.00 kg/m      Assessment & Plan:  1. Generalized anxiety disorder - CMP14+EGFR - diazepam (VALIUM) 5 MG tablet; TAKE 1/2 TO 1 TABLET EVERY 8 HOURS AS NEEDED  Dispense: 90 tablet; Refill: 4  2. Mixed hyperlipidemia - CMP14+EGFR - Lipid panel  3. Migraine without aura and without status migrainosus, not intractable  - CMP14+EGFR  4. Vitamin D deficiency - CMP14+EGFR  5. Hypokalemia  - CMP14+EGFR   Continue all meds Labs pending Health Maintenance reviewed Diet and exercise encouraged RTO 6 months   Evelina Dun, FNP

## 2017-10-25 ENCOUNTER — Other Ambulatory Visit: Payer: Self-pay | Admitting: Family

## 2017-10-25 MED ORDER — PITAVASTATIN CALCIUM 2 MG PO TABS: 2.0000 mg | ORAL_TABLET | Freq: Every day | ORAL | 1 refills | Status: DC

## 2017-10-28 ENCOUNTER — Telehealth: Payer: Self-pay

## 2017-10-28 NOTE — Telephone Encounter (Signed)
Insurance denied Livalo   Must try and fail two of the floowing  Atorvastatin Fluvastatin Lovastatin Pravastatin Simvastatin

## 2017-10-28 NOTE — Telephone Encounter (Signed)
PT states she has tried these in the past

## 2018-01-20 ENCOUNTER — Other Ambulatory Visit: Payer: Self-pay | Admitting: Family

## 2018-01-20 DIAGNOSIS — J019 Acute sinusitis, unspecified: Secondary | ICD-10-CM

## 2018-03-24 ENCOUNTER — Other Ambulatory Visit: Payer: Self-pay | Admitting: Family

## 2018-03-24 DIAGNOSIS — F411 Generalized anxiety disorder: Secondary | ICD-10-CM

## 2018-03-24 NOTE — Telephone Encounter (Signed)
Last seen 10/24/17  [facility]

## 2018-04-24 ENCOUNTER — Other Ambulatory Visit: Payer: Self-pay | Admitting: Family

## 2018-04-24 DIAGNOSIS — F411 Generalized anxiety disorder: Secondary | ICD-10-CM

## 2018-04-25 NOTE — Telephone Encounter (Signed)
Last seen 10/24/17  [facility]

## 2018-04-25 NOTE — Telephone Encounter (Signed)
Patient NTBS for follow up and lab work  

## 2018-04-25 NOTE — Telephone Encounter (Signed)
Patient aware. Has apt Friday.

## 2018-04-28 ENCOUNTER — Ambulatory Visit: Payer: 59 | Admitting: Family

## 2018-04-28 ENCOUNTER — Encounter: Payer: Self-pay | Admitting: Family

## 2018-04-28 VITALS — BP 125/82 | HR 94 | Temp 96.8°F | Ht 67.5 in | Wt 140.6 lb

## 2018-04-28 DIAGNOSIS — Z79899 Other long term (current) drug therapy: Secondary | ICD-10-CM

## 2018-04-28 DIAGNOSIS — F411 Generalized anxiety disorder: Secondary | ICD-10-CM

## 2018-04-28 DIAGNOSIS — E782 Mixed hyperlipidemia: Secondary | ICD-10-CM

## 2018-04-28 DIAGNOSIS — G43009 Migraine without aura, not intractable, without status migrainosus: Secondary | ICD-10-CM | POA: Diagnosis not present

## 2018-04-28 DIAGNOSIS — E559 Vitamin D deficiency, unspecified: Secondary | ICD-10-CM | POA: Diagnosis not present

## 2018-04-28 DIAGNOSIS — F1320 Sedative, hypnotic or anxiolytic dependence, uncomplicated: Secondary | ICD-10-CM

## 2018-04-28 LAB — CMP14+EGFR
ALBUMIN: 4.5 g/dL (ref 3.5–5.5)
ALK PHOS: 118 IU/L — AB (ref 39–117)
ALT: 13 IU/L (ref 0–32)
AST: 16 IU/L (ref 0–40)
Albumin/Globulin Ratio: 2.1 (ref 1.2–2.2)
BUN / CREAT RATIO: 12 (ref 9–23)
BUN: 10 mg/dL (ref 6–24)
CHLORIDE: 103 mmol/L (ref 96–106)
CO2: 24 mmol/L (ref 20–29)
Calcium: 9.7 mg/dL (ref 8.7–10.2)
Creatinine, Ser: 0.85 mg/dL (ref 0.57–1.00)
GFR calc Af Amer: 87 mL/min/{1.73_m2} (ref >59)
GFR calc non Af Amer: 76 mL/min/{1.73_m2} (ref >59)
Globulin, Total: 2.1 g/dL (ref 1.5–4.5)
Glucose: 83 mg/dL (ref 65–99)
Potassium: 3.8 mmol/L (ref 3.5–5.2)
SODIUM: 144 mmol/L (ref 134–144)
Total Protein: 6.6 g/dL (ref 6.0–8.5)

## 2018-04-28 MED ORDER — VENLAFAXINE HCL ER 150 MG PO CP24: ORAL_CAPSULE | ORAL | 2 refills | Status: DC

## 2018-04-28 MED ORDER — DIAZEPAM 5 MG PO TABS: 5.0000 mg | ORAL_TABLET | Freq: Three times a day (TID) | ORAL | 5 refills | Status: DC | PRN

## 2018-04-28 NOTE — Progress Notes (Signed)
Subjective:    Patient ID: Theresa Pham, female    DOB: 1961-05-06, 57 y.o.   MRN: [id number]  Chief Complaint  Patient presents with  . Anxiety    6 month follow up  . Headache    Patient states she went to the eye doctor yesterday and getting a stronger rx and thinks that is some of her problem with the headaches.    Headache   This is a chronic problem. The current episode started more than 1 year ago. The problem occurs daily (about every 2 days over the last two weeks, she had her eyes checked and states she was straining and believes this has caused these headaches. Her new glasses will be here in 2 weeks. ). The problem has been waxing and waning. The pain is located in the left unilateral region. The pain does not radiate. The pain quality is similar to prior headaches. The quality of the pain is described as aching and dull. The pain is at a severity of 3/10. The pain is mild. Associated symptoms include nausea, phonophobia and photophobia. Pertinent negatives include no coughing, dizziness, drainage or ear pain. She has tried NSAIDs for the symptoms. The treatment provided mild relief. Her past medical history is significant for migraine headaches.  Anxiety  Presents for follow-up visit. Symptoms include excessive worry, irritability, nausea, nervous/anxious behavior and restlessness. Patient reports no dizziness. Symptoms occur most days. The severity of symptoms is moderate. The quality of sleep is good.    Hyperlipidemia  This is a chronic problem. The current episode started more than 1 year ago. The problem is uncontrolled. Recent lipid tests were reviewed and are high. Current antihyperlipidemic treatment includes diet change. The current treatment provides no improvement of lipids. Risk factors for coronary artery disease include dyslipidemia, hypertension, a sedentary lifestyle and post-menopausal.      Review of Systems  Constitutional: Positive for irritability.    HENT: Negative for ear pain.   Eyes: Positive for photophobia.  Respiratory: Negative for cough.   Gastrointestinal: Positive for nausea.  Neurological: Positive for headaches. Negative for dizziness.  Psychiatric/Behavioral: The patient is nervous/anxious.   All other systems reviewed and are negative.  Family History  Problem Relation Age of Onset  . Hypertension Mother   . Uterine cancer Mother   . Cancer Mother        uterine  . Migraines Mother   . Heart attack Father 25  . Migraines Daughter   . Breast cancer Maternal Aunt   . Cancer Maternal Aunt        breast  . Colon cancer Neg Hx     Social History   Socioeconomic History  . Marital status: Married    Spouse name: Mikki Santee  . Number of children: 1  . Years of education: college3  . Highest education level: Not on file  Occupational History  . Occupation: town Copy.  . Occupation: TOWN Location manager: [place]  Social Needs  . Financial resource strain: Not on file  . Food insecurity:    Worry: Not on file    Inability: Not on file  . Transportation needs:    Medical: Not on file    Non-medical: Not on file  Tobacco Use  . Smoking status: Former Smoker    Years: 7.00    Types: Cigarettes    Last attempt to quit: 08/09/1978    Years since quitting: 39.7  . Smokeless tobacco: Never  Used  Substance and Sexual Activity  . Alcohol use: No    Alcohol/week: 0.0 standard drinks    Comment: rare  . Drug use: No  . Sexual activity: Yes  Lifestyle  . Physical activity:    Days per week: Not on file    Minutes per session: Not on file  . Stress: Not on file  Relationships  . Social connections:    Talks on phone: Not on file    Gets together: Not on file    Attends religious service: Not on file    Active member of club or organization: Not on file    Attends meetings of clubs or organizations: Not on file    Relationship status: Not on file  Other Topics Concern  . Not on  file  Social History Narrative   Patient lives at home husband Mikki Santee.    Patient has 1 child.    Patient has a college education.    Patient is right handed.    Patient drinks about 24 ounces of mountain dew daily.       Objective:   Physical Exam  Constitutional: She is oriented to person, place, and time. She appears well-developed and well-nourished. No distress.  HENT:  Head: Normocephalic and atraumatic.  Right Ear: External ear normal.  Mouth/Throat: Oropharynx is clear and moist.  Eyes: Pupils are equal, round, and reactive to light.  Neck: Normal range of motion. Neck supple. No thyromegaly present.  Cardiovascular: Normal rate, regular rhythm, normal heart sounds and intact distal pulses.  No murmur heard. Pulmonary/Chest: Effort normal and breath sounds normal. No respiratory distress. She has no wheezes.  Abdominal: Soft. Bowel sounds are normal. She exhibits no distension. There is no tenderness.  Musculoskeletal: Normal range of motion. She exhibits no edema or tenderness.  Neurological: She is alert and oriented to person, place, and time. She has normal reflexes. No cranial nerve deficit.  Skin: Skin is warm and dry.  Psychiatric: She has a normal mood and affect. Her behavior is normal. Judgment and thought content normal.  Vitals reviewed.     BP 125/82   Pulse 94   Temp (!) 96.8 F (36 C) (Oral)   Ht 5' 7.5" (1.715 m)   Wt 140 lb 9.6 oz (63.8 kg)   BMI 21.70 kg/m      Assessment & Plan:  [name] comes in today with chief complaint of Anxiety (6 month follow up) and Headache (Patient states she went to the eye doctor yesterday and getting a stronger rx and thinks that is some of her problem with the headaches.)   Diagnosis and orders addressed:  1. Generalized anxiety disorder Stress managment - venlafaxine XR (EFFEXOR-XR) 150 MG 24 hr capsule; TAKE 1 CAPSULE EVERY MORNING WITH BREAKFAST  Dispense: 90 capsule; Refill: 2 - diazepam (VALIUM) 5  MG tablet; Take 1 tablet (5 mg total) by mouth every 8 (eight) hours as needed for anxiety.  Dispense: 90 tablet; Refill: 5 - CMP14+EGFR  2. Mixed hyperlipidemia - CMP14+EGFR  3. Migraine without aura and without status migrainosus, not intractable -Start wearing new rx of glasses - CMP14+EGFR  4. Vitamin D deficiency - CMP14+EGFR  5. Controlled substance agreement signed - diazepam (VALIUM) 5 MG tablet; Take 1 tablet (5 mg total) by mouth every 8 (eight) hours as needed for anxiety.  Dispense: 90 tablet; Refill: 5 - CMP14+EGFR - ToxASSURE Select 13 (MW), Urine  6. Benzodiazepine dependence (HCC) - diazepam (VALIUM) 5 MG tablet;  Take 1 tablet (5 mg total) by mouth every 8 (eight) hours as needed for anxiety.  Dispense: 90 tablet; Refill: 5 - CMP14+EGFR - ToxASSURE Select 13 (MW), Urine   Labs pending Health Maintenance reviewed Diet and exercise encouraged  Follow up plan: 6 weeks    Evelina Dun, FNP

## 2018-04-28 NOTE — Patient Instructions (Signed)

## 2018-05-03 LAB — TOXASSURE SELECT 13 (MW), URINE

## 2018-05-15 ENCOUNTER — Other Ambulatory Visit: Payer: Self-pay | Admitting: Family

## 2018-05-15 DIAGNOSIS — G43009 Migraine without aura, not intractable, without status migrainosus: Secondary | ICD-10-CM

## 2018-08-18 ENCOUNTER — Telehealth: Payer: Self-pay | Admitting: Family

## 2018-08-18 MED ORDER — OSELTAMIVIR PHOSPHATE 75 MG PO CAPS: 75.0000 mg | ORAL_CAPSULE | Freq: Every day | ORAL | 0 refills | Status: DC

## 2018-08-18 NOTE — Telephone Encounter (Signed)
Patient aware, per voice mail left on phone,  script is ready.

## 2018-08-18 NOTE — Telephone Encounter (Signed)
Tamiflu Prescription sent to pharmacy. She will take daily for 10 days. However, if she develops symptoms she will increase to twice a day until tablets are gone.

## 2018-08-23 ENCOUNTER — Other Ambulatory Visit: Payer: Self-pay | Admitting: Family

## 2018-08-23 DIAGNOSIS — G43009 Migraine without aura, not intractable, without status migrainosus: Secondary | ICD-10-CM

## 2018-10-24 ENCOUNTER — Other Ambulatory Visit: Payer: Self-pay | Admitting: Family

## 2018-10-24 DIAGNOSIS — G43009 Migraine without aura, not intractable, without status migrainosus: Secondary | ICD-10-CM

## 2018-11-16 ENCOUNTER — Other Ambulatory Visit: Payer: Self-pay | Admitting: Family

## 2018-11-16 DIAGNOSIS — F411 Generalized anxiety disorder: Secondary | ICD-10-CM

## 2018-11-16 DIAGNOSIS — F1320 Sedative, hypnotic or anxiolytic dependence, uncomplicated: Secondary | ICD-10-CM

## 2018-11-16 DIAGNOSIS — Z79899 Other long term (current) drug therapy: Secondary | ICD-10-CM

## 2018-11-16 NOTE — Telephone Encounter (Signed)
Scheduled patient for telephone follow up on 11/22/2018.

## 2018-11-22 ENCOUNTER — Other Ambulatory Visit: Payer: Self-pay

## 2018-11-22 ENCOUNTER — Encounter: Payer: Self-pay | Admitting: Family

## 2018-11-22 ENCOUNTER — Ambulatory Visit (INDEPENDENT_AMBULATORY_CARE_PROVIDER_SITE_OTHER): Payer: 59 | Admitting: Family

## 2018-11-22 DIAGNOSIS — G43009 Migraine without aura, not intractable, without status migrainosus: Secondary | ICD-10-CM

## 2018-11-22 DIAGNOSIS — Z79899 Other long term (current) drug therapy: Secondary | ICD-10-CM

## 2018-11-22 DIAGNOSIS — J301 Allergic rhinitis due to pollen: Secondary | ICD-10-CM

## 2018-11-22 DIAGNOSIS — F1320 Sedative, hypnotic or anxiolytic dependence, uncomplicated: Secondary | ICD-10-CM

## 2018-11-22 DIAGNOSIS — F411 Generalized anxiety disorder: Secondary | ICD-10-CM

## 2018-11-22 DIAGNOSIS — E782 Mixed hyperlipidemia: Secondary | ICD-10-CM | POA: Diagnosis not present

## 2018-11-22 MED ORDER — FLUTICASONE PROPIONATE 50 MCG/ACT NA SUSP: NASAL | 6 refills | Status: DC

## 2018-11-22 MED ORDER — DIAZEPAM 5 MG PO TABS: ORAL_TABLET | ORAL | 1 refills | Status: DC

## 2018-11-22 NOTE — Progress Notes (Signed)
Virtual Visit via telephone Note  I connected with Theresa Pham on 11/22/18 at 9:55 AM by telephone and verified that I am speaking with the correct person using two identifiers. Theresa Pham is currently located at work  and no one is currently with her during visit. The provider, Evelina Dun, FNP is located in their office at time of visit.  I discussed the limitations, risks, security and privacy concerns of performing an evaluation and management service by telephone and the availability of in person appointments. I also discussed with the patient that there may be a patient responsible charge related to this service. The patient expressed understanding and agreed to proceed.   History and Present Illness:   Pt calls today for chronic follow up.  Anxiety  Presents for follow-up visit. Symptoms include excessive worry, irritability, nausea, nervous/anxious behavior and restlessness. Symptoms occur occasionally. The severity of symptoms is moderate. The quality of sleep is good.    Migraine   This is a chronic problem. The current episode started more than 1 year ago. The problem occurs monthly. The problem has been waxing and waning. The pain is located in the left unilateral region. The pain quality is similar to prior headaches. Associated symptoms include nausea, phonophobia, photophobia and rhinorrhea. Pertinent negatives include no vomiting. She has tried triptans for the symptoms. The treatment provided moderate relief.  Hyperlipidemia  This is a chronic problem. The current episode started more than 1 year ago. The problem is uncontrolled. Recent lipid tests were reviewed and are high. Current antihyperlipidemic treatment includes diet change. The current treatment provides mild improvement of lipids.  Sinus Problem  This is a chronic problem. The current episode started more than 1 year ago. Associated symptoms include sneezing.      Review of Systems   Constitutional: Positive for irritability.  HENT: Positive for rhinorrhea and sneezing.   Eyes: Positive for photophobia.  Gastrointestinal: Positive for nausea. Negative for vomiting.  Psychiatric/Behavioral: The patient is nervous/anxious.   All other systems reviewed and are negative.   Observations/Objective: No SOB or distress noted  Assessment and Plan: Theresa Pham comes in today with chief complaint of No chief complaint on file.   Diagnosis and orders addressed:  1. Generalized anxiety disorder - diazepam (VALIUM) 5 MG tablet; TAKE 1 TABLET EVERY 8 HOURS AS NEEDED FOR ANXIETY  Dispense: 90 tablet; Refill: 1  2. Mixed hyperlipidemia  3. Migraine without aura and without status migrainosus, not intractable  4. Allergic rhinitis due to pollen, unspecified seasonality - fluticasone (FLONASE) 50 MCG/ACT nasal spray; USE 1 TO 2 SPRAYS IN EACH NOSTRIL AT BEDTIME  Dispense: 16 g; Refill: 6  5. Controlled substance agreement signed - diazepam (VALIUM) 5 MG tablet; TAKE 1 TABLET EVERY 8 HOURS AS NEEDED FOR ANXIETY  Dispense: 90 tablet; Refill: 1  6. Benzodiazepine dependence (HCC) - diazepam (VALIUM) 5 MG tablet; TAKE 1 TABLET EVERY 8 HOURS AS NEEDED FOR ANXIETY  Dispense: 90 tablet; Refill: 1    PT reviewed in [place] controlled database- No red flags Health Maintenance reviewed Diet and exercise encouraged  Follow up plan: 3 months        I discussed the assessment and treatment plan with the patient. The patient was provided an opportunity to ask questions and all were answered. The patient agreed with the plan and demonstrated an understanding of the instructions.   The patient was advised to call back or seek an in-person evaluation if the symptoms worsen  or if the condition fails to improve as anticipated.  The above assessment and management plan was discussed with the patient. The patient verbalized understanding of and has agreed to the management plan.  Patient is aware to call the clinic if symptoms persist or worsen. Patient is aware when to return to the clinic for a follow-up visit. Patient educated on when it is appropriate to go to the emergency department.    Call ended 10:10 AM, I provided 15 minutes of non-face-to-face time during this encounter.    Evelina Dun, FNP

## 2019-01-05 ENCOUNTER — Other Ambulatory Visit: Payer: Self-pay | Admitting: Family

## 2019-01-05 DIAGNOSIS — F411 Generalized anxiety disorder: Secondary | ICD-10-CM

## 2019-01-12 ENCOUNTER — Other Ambulatory Visit: Payer: Self-pay | Admitting: Family

## 2019-01-12 DIAGNOSIS — G43009 Migraine without aura, not intractable, without status migrainosus: Secondary | ICD-10-CM

## 2019-01-23 ENCOUNTER — Other Ambulatory Visit: Payer: Self-pay

## 2019-01-23 ENCOUNTER — Other Ambulatory Visit: Payer: 59

## 2019-01-23 DIAGNOSIS — Z20822 Contact with and (suspected) exposure to covid-19: Secondary | ICD-10-CM

## 2019-01-25 ENCOUNTER — Encounter (INDEPENDENT_AMBULATORY_CARE_PROVIDER_SITE_OTHER): Payer: Self-pay

## 2019-01-25 LAB — NOVEL CORONAVIRUS, NAA: SARS-CoV-2, NAA: NOT DETECTED

## 2019-01-26 ENCOUNTER — Encounter (INDEPENDENT_AMBULATORY_CARE_PROVIDER_SITE_OTHER): Payer: Self-pay

## 2019-01-27 ENCOUNTER — Encounter (INDEPENDENT_AMBULATORY_CARE_PROVIDER_SITE_OTHER): Payer: Self-pay

## 2019-01-28 ENCOUNTER — Encounter (INDEPENDENT_AMBULATORY_CARE_PROVIDER_SITE_OTHER): Payer: Self-pay

## 2019-01-29 ENCOUNTER — Encounter (INDEPENDENT_AMBULATORY_CARE_PROVIDER_SITE_OTHER): Payer: Self-pay

## 2019-01-30 ENCOUNTER — Encounter (INDEPENDENT_AMBULATORY_CARE_PROVIDER_SITE_OTHER): Payer: Self-pay

## 2019-01-31 ENCOUNTER — Encounter (INDEPENDENT_AMBULATORY_CARE_PROVIDER_SITE_OTHER): Payer: Self-pay

## 2019-02-01 ENCOUNTER — Encounter (INDEPENDENT_AMBULATORY_CARE_PROVIDER_SITE_OTHER): Payer: Self-pay

## 2019-02-02 ENCOUNTER — Encounter (INDEPENDENT_AMBULATORY_CARE_PROVIDER_SITE_OTHER): Payer: Self-pay

## 2019-02-03 ENCOUNTER — Encounter (INDEPENDENT_AMBULATORY_CARE_PROVIDER_SITE_OTHER): Payer: Self-pay

## 2019-02-04 ENCOUNTER — Encounter (INDEPENDENT_AMBULATORY_CARE_PROVIDER_SITE_OTHER): Payer: Self-pay

## 2019-02-05 ENCOUNTER — Encounter (INDEPENDENT_AMBULATORY_CARE_PROVIDER_SITE_OTHER): Payer: Self-pay

## 2019-02-26 ENCOUNTER — Other Ambulatory Visit: Payer: Self-pay | Admitting: Family

## 2019-02-26 DIAGNOSIS — F1320 Sedative, hypnotic or anxiolytic dependence, uncomplicated: Secondary | ICD-10-CM

## 2019-02-26 DIAGNOSIS — G43009 Migraine without aura, not intractable, without status migrainosus: Secondary | ICD-10-CM

## 2019-02-26 DIAGNOSIS — F411 Generalized anxiety disorder: Secondary | ICD-10-CM

## 2019-02-26 DIAGNOSIS — Z79899 Other long term (current) drug therapy: Secondary | ICD-10-CM

## 2019-03-30 ENCOUNTER — Other Ambulatory Visit: Payer: Self-pay | Admitting: Family

## 2019-03-30 DIAGNOSIS — F411 Generalized anxiety disorder: Secondary | ICD-10-CM

## 2019-03-30 NOTE — Telephone Encounter (Signed)
Please advise.  Last seen 11/2018

## 2019-04-14 ENCOUNTER — Telehealth: Payer: 59 | Admitting: Family

## 2019-04-14 DIAGNOSIS — L237 Allergic contact dermatitis due to plants, except food: Secondary | ICD-10-CM

## 2019-04-14 MED ORDER — PREDNISONE 20 MG PO TABS: 20.0000 mg | ORAL_TABLET | Freq: Two times a day (BID) | ORAL | 0 refills | Status: DC

## 2019-04-14 NOTE — Progress Notes (Signed)

## 2019-05-14 ENCOUNTER — Other Ambulatory Visit: Payer: Self-pay | Admitting: Family

## 2019-05-14 DIAGNOSIS — G43009 Migraine without aura, not intractable, without status migrainosus: Secondary | ICD-10-CM

## 2019-06-01 ENCOUNTER — Other Ambulatory Visit: Payer: Self-pay | Admitting: Family

## 2019-06-01 DIAGNOSIS — F411 Generalized anxiety disorder: Secondary | ICD-10-CM

## 2019-06-01 DIAGNOSIS — Z79899 Other long term (current) drug therapy: Secondary | ICD-10-CM

## 2019-06-01 DIAGNOSIS — F1320 Sedative, hypnotic or anxiolytic dependence, uncomplicated: Secondary | ICD-10-CM

## 2019-06-15 ENCOUNTER — Other Ambulatory Visit: Payer: Self-pay | Admitting: Family

## 2019-06-15 DIAGNOSIS — G43009 Migraine without aura, not intractable, without status migrainosus: Secondary | ICD-10-CM

## 2019-06-22 ENCOUNTER — Other Ambulatory Visit: Payer: Self-pay | Admitting: Family

## 2019-06-22 DIAGNOSIS — F411 Generalized anxiety disorder: Secondary | ICD-10-CM

## 2019-07-02 ENCOUNTER — Other Ambulatory Visit: Payer: Self-pay | Admitting: Family

## 2019-07-02 DIAGNOSIS — F411 Generalized anxiety disorder: Secondary | ICD-10-CM

## 2019-07-02 DIAGNOSIS — Z79899 Other long term (current) drug therapy: Secondary | ICD-10-CM

## 2019-07-02 DIAGNOSIS — F1320 Sedative, hypnotic or anxiolytic dependence, uncomplicated: Secondary | ICD-10-CM

## 2019-07-03 ENCOUNTER — Other Ambulatory Visit: Payer: Self-pay | Admitting: Family

## 2019-07-09 ENCOUNTER — Other Ambulatory Visit: Payer: Self-pay | Admitting: Family Medicine

## 2019-07-09 ENCOUNTER — Ambulatory Visit (INDEPENDENT_AMBULATORY_CARE_PROVIDER_SITE_OTHER): Payer: 59 | Admitting: Family

## 2019-07-09 ENCOUNTER — Encounter: Payer: Self-pay | Admitting: Family

## 2019-07-09 ENCOUNTER — Telehealth: Payer: Self-pay | Admitting: Family

## 2019-07-09 DIAGNOSIS — F1320 Sedative, hypnotic or anxiolytic dependence, uncomplicated: Secondary | ICD-10-CM | POA: Insufficient documentation

## 2019-07-09 DIAGNOSIS — F411 Generalized anxiety disorder: Secondary | ICD-10-CM

## 2019-07-09 DIAGNOSIS — E559 Vitamin D deficiency, unspecified: Secondary | ICD-10-CM

## 2019-07-09 DIAGNOSIS — Z79899 Other long term (current) drug therapy: Secondary | ICD-10-CM | POA: Insufficient documentation

## 2019-07-09 DIAGNOSIS — J301 Allergic rhinitis due to pollen: Secondary | ICD-10-CM | POA: Diagnosis not present

## 2019-07-09 DIAGNOSIS — G43009 Migraine without aura, not intractable, without status migrainosus: Secondary | ICD-10-CM

## 2019-07-09 MED ORDER — DIAZEPAM 5 MG PO TABS: 5.0000 mg | ORAL_TABLET | Freq: Three times a day (TID) | ORAL | 1 refills | Status: DC | PRN

## 2019-07-09 MED ORDER — DIAZEPAM 5 MG PO TABS: 5.0000 mg | ORAL_TABLET | Freq: Three times a day (TID) | ORAL | 0 refills | Status: DC | PRN

## 2019-07-09 NOTE — Progress Notes (Signed)
Virtual Visit via telephone Note Due to COVID-19 pandemic this visit was conducted virtually. This visit type was conducted due to national recommendations for restrictions regarding the COVID-19 Pandemic (e.g. social distancing, sheltering in place) in an effort to limit this patient's exposure and mitigate transmission in our community. All issues noted in this document were discussed and addressed.  A physical exam was not performed with this format.  I connected with Theresa Pham on 07/09/19 at 8:55 AM by telephone and verified that I am speaking with the correct person using two identifiers. Theresa Pham is currently located at work and co-worker  is currcently with her during visit. The provider, Evelina Dun, FNP is located in their office at time of visit.  I discussed the limitations, risks, security and privacy concerns of performing an evaluation and management service by telephone and the availability of in person appointments. I also discussed with the patient that there may be a patient responsible charge related to this service. The patient expressed understanding and agreed to proceed.   History and Present Illness:  Pt calls the office today for chronic follow up. She is requesting her Valium refilled. Anxiety Presents for follow-up visit. Symptoms include decreased concentration, depressed mood, excessive worry, insomnia, irritability, nausea, nervous/anxious behavior and shortness of breath. Symptoms occur most days. The severity of symptoms is moderate. The quality of sleep is good.    Migraine  This is a chronic problem. The current episode started more than 1 year ago. The problem occurs intermittently. The pain is located in the frontal region. The pain does not radiate. The pain quality is similar to prior headaches. Associated symptoms include insomnia, nausea, phonophobia and photophobia. She has tried triptans for the symptoms. The treatment provided moderate  relief. Her past medical history is significant for migraine headaches.       Review of Systems  Constitutional: Positive for irritability.  Eyes: Positive for photophobia.  Respiratory: Positive for shortness of breath.   Gastrointestinal: Positive for nausea.  Psychiatric/Behavioral: Positive for decreased concentration. The patient is nervous/anxious and has insomnia.   All other systems reviewed and are negative.    Observations/Objective: No SOB or distress noted   Assessment and Plan: 1. Generalized anxiety disorder Pt's contract is out of date. Will come in the office in next 1-2 months and resign contract.  We will also do urine drug at that time PT reviewed in [place] controlled database- no red flags noted - diazepam (VALIUM) 5 MG tablet; Take 1 tablet (5 mg total) by mouth every 8 (eight) hours as needed for anxiety.  Dispense: 90 tablet; Refill: 1  2. Migraine without aura and without status migrainosus, not intractable  3. Allergic rhinitis due to pollen, unspecified seasonality  4. Vitamin D deficiency  5. Controlled substance agreement signed - diazepam (VALIUM) 5 MG tablet; Take 1 tablet (5 mg total) by mouth every 8 (eight) hours as needed for anxiety.  Dispense: 90 tablet; Refill: 1  6. Benzodiazepine dependence (HCC) - diazepam (VALIUM) 5 MG tablet; Take 1 tablet (5 mg total) by mouth every 8 (eight) hours as needed for anxiety.  Dispense: 90 tablet; Refill: 1     I discussed the assessment and treatment plan with the patient. The patient was provided an opportunity to ask questions and all were answered. The patient agreed with the plan and demonstrated an understanding of the instructions.   The patient was advised to call back or seek an in-person evaluation if the  symptoms worsen or if the condition fails to improve as anticipated.  The above assessment and management plan was discussed with the patient. The patient verbalized understanding of and has  agreed to the management plan. Patient is aware to call the clinic if symptoms persist or worsen. Patient is aware when to return to the clinic for a follow-up visit. Patient educated on when it is appropriate to go to the emergency department.   Time call ended: 9:15 AM    I provided 20 minutes of non-face-to-face time during this encounter.    Evelina Dun, FNP

## 2019-07-09 NOTE — Progress Notes (Signed)
Sent a 1 month prescription for the patient because her PCPs and impravata was not working

## 2019-07-16 ENCOUNTER — Other Ambulatory Visit: Payer: Self-pay | Admitting: Family

## 2019-07-16 DIAGNOSIS — G43009 Migraine without aura, not intractable, without status migrainosus: Secondary | ICD-10-CM

## 2019-08-09 ENCOUNTER — Other Ambulatory Visit: Payer: Self-pay | Admitting: Family Medicine

## 2019-08-09 DIAGNOSIS — F1320 Sedative, hypnotic or anxiolytic dependence, uncomplicated: Secondary | ICD-10-CM

## 2019-08-09 DIAGNOSIS — F411 Generalized anxiety disorder: Secondary | ICD-10-CM

## 2019-08-09 DIAGNOSIS — Z79899 Other long term (current) drug therapy: Secondary | ICD-10-CM

## 2019-08-17 ENCOUNTER — Other Ambulatory Visit: Payer: Self-pay

## 2019-08-17 ENCOUNTER — Ambulatory Visit (INDEPENDENT_AMBULATORY_CARE_PROVIDER_SITE_OTHER): Payer: 59 | Admitting: Family

## 2019-08-17 ENCOUNTER — Encounter: Payer: Self-pay | Admitting: Family

## 2019-08-17 VITALS — BP 136/84 | HR 90 | Temp 97.3°F | Ht 67.5 in | Wt 138.2 lb

## 2019-08-17 DIAGNOSIS — G43009 Migraine without aura, not intractable, without status migrainosus: Secondary | ICD-10-CM

## 2019-08-17 DIAGNOSIS — F411 Generalized anxiety disorder: Secondary | ICD-10-CM | POA: Diagnosis not present

## 2019-08-17 DIAGNOSIS — E782 Mixed hyperlipidemia: Secondary | ICD-10-CM | POA: Diagnosis not present

## 2019-08-17 DIAGNOSIS — Z79899 Other long term (current) drug therapy: Secondary | ICD-10-CM

## 2019-08-17 DIAGNOSIS — E559 Vitamin D deficiency, unspecified: Secondary | ICD-10-CM | POA: Diagnosis not present

## 2019-08-17 DIAGNOSIS — F1320 Sedative, hypnotic or anxiolytic dependence, uncomplicated: Secondary | ICD-10-CM

## 2019-08-17 MED ORDER — VENLAFAXINE HCL ER 150 MG PO CP24: ORAL_CAPSULE | ORAL | 2 refills | Status: DC

## 2019-08-17 MED ORDER — DIAZEPAM 5 MG PO TABS: ORAL_TABLET | ORAL | 5 refills | Status: DC

## 2019-08-17 MED ORDER — SUMATRIPTAN SUCCINATE 100 MG PO TABS: ORAL_TABLET | ORAL | 2 refills | Status: DC

## 2019-08-17 NOTE — Progress Notes (Signed)
Subjective:    Patient ID: Theresa Pham, female    DOB: 1961-07-17, 59 y.o.   MRN: [id number]  Chief Complaint  Patient presents with  . Medical Management of Chronic Issues   Pt calls the office today for chronic follow up. She is requesting her Valium refilled Anxiety Presents for follow-up visit. Symptoms include depressed mood, excessive worry, irritability and nervous/anxious behavior. Patient reports no nausea. Symptoms occur most days.    Hyperlipidemia This is a chronic problem. The current episode started more than 1 year ago. The problem is uncontrolled. Recent lipid tests were reviewed and are high. Current antihyperlipidemic treatment includes diet change. The current treatment provides no improvement of lipids.  Headache  This is a chronic problem. The current episode started more than 1 year ago. The problem occurs intermittently (weekly). The problem has been waxing and waning. The pain is located in the frontal region. The pain quality is similar to prior headaches. The pain is moderate. Associated symptoms include phonophobia and photophobia. Pertinent negatives include no nausea or vomiting. The symptoms are aggravated by noise. She has tried triptans for the symptoms. The treatment provided moderate relief. Her past medical history is significant for migraine headaches.      Review of Systems  Constitutional: Positive for irritability.  Eyes: Positive for photophobia.  Gastrointestinal: Negative for nausea and vomiting.  Neurological: Positive for headaches.  Psychiatric/Behavioral: The patient is nervous/anxious.   All other systems reviewed and are negative.      Objective:   Physical Exam Vitals reviewed.  Constitutional:      General: She is not in acute distress.    Appearance: She is well-developed.  HENT:     Head: Normocephalic and atraumatic.     Right Ear: Tympanic membrane normal.     Left Ear: Tympanic membrane normal.  Eyes:     Pupils:  Pupils are equal, round, and reactive to light.  Neck:     Thyroid: No thyromegaly.  Cardiovascular:     Rate and Rhythm: Normal rate and regular rhythm.     Heart sounds: Normal heart sounds. No murmur.  Pulmonary:     Effort: Pulmonary effort is normal. No respiratory distress.     Breath sounds: Normal breath sounds. No wheezing.  Abdominal:     General: Bowel sounds are normal. There is no distension.     Palpations: Abdomen is soft.     Tenderness: There is no abdominal tenderness.  Musculoskeletal:        General: No tenderness. Normal range of motion.     Cervical back: Normal range of motion and neck supple.  Skin:    General: Skin is warm and dry.  Neurological:     Mental Status: She is alert and oriented to person, place, and time.     Cranial Nerves: No cranial nerve deficit.     Deep Tendon Reflexes: Reflexes are normal and symmetric.  Psychiatric:        Behavior: Behavior normal.        Thought Content: Thought content normal.        Judgment: Judgment normal.     BP 136/84   Pulse 90   Temp (!) 97.3 F (36.3 C) (Temporal)   Ht 5' 7.5" (1.715 m)   Wt 138 lb 3.2 oz (62.7 kg)   SpO2 98%   BMI 21.33 kg/m        Assessment & Plan:  Theresa Pham comes in today with chief  complaint of Medical Management of Chronic Issues   Diagnosis and orders addressed:  1. Generalized anxiety disorder - venlafaxine XR (EFFEXOR-XR) 150 MG 24 hr capsule; TAKE 1 CAPSULE EVERY MORNING WITH BREAKFAST (Needs to be seen before next refill)  Dispense: 90 capsule; Refill: 2 - diazepam (VALIUM) 5 MG tablet; TAKE 1 TABLET EVERY 8 HOURS AS NEEDED FOR ANXIETY  Dispense: 90 tablet; Refill: 5 - CMP14+EGFR - CBC with Differential/Platelet  2. Mixed hyperlipidemia - CMP14+EGFR - CBC with Differential/Platelet - Lipid panel  3. Vitamin D deficiency - CMP14+EGFR - CBC with Differential/Platelet - Vitamin D 25 hydroxy  4. Benzodiazepine dependence ([facility]CC) - diazepam (VALIUM)  5 MG tablet; TAKE 1 TABLET EVERY 8 HOURS AS NEEDED FOR ANXIETY  Dispense: 90 tablet; Refill: 5 - CMP14+EGFR - CBC with Differential/Platelet - DRUG SCREEN-TOXASSURE  5. Controlled substance agreement signed - diazepam (VALIUM) 5 MG tablet; TAKE 1 TABLET EVERY 8 HOURS AS NEEDED FOR ANXIETY  Dispense: 90 tablet; Refill: 5 - CMP14+EGFR - CBC with Differential/Platelet - DRUG SCREEN-TOXASSURE  6. Migraine without aura and without status migrainosus, not intractable - SUMAtriptan (IMITREX) 100 MG tablet; May repeat in 2 hours if headache persists or recurs.  Dispense: 18 tablet; Refill: 2 - CMP14+EGFR - CBC with Differential/Platelet   Labs pending Drug screen and urine updated today. Pt reviewed in [place] controlled database- No red flags noted Health Maintenance reviewed Diet and exercise encouraged  Follow up plan: 6 months    Evelina Dun, FNP

## 2019-08-17 NOTE — Patient Instructions (Signed)

## 2019-08-18 LAB — CBC WITH DIFFERENTIAL/PLATELET
Basophils Absolute: 0.1 10*3/uL (ref 0.0–0.2)
Basos: 1 %
EOS (ABSOLUTE): 0.3 10*3/uL (ref 0.0–0.4)
Eos: 3 %
Hematocrit: 40.6 % (ref 34.0–46.6)
Hemoglobin: 13.6 g/dL (ref 11.1–15.9)
Immature Grans (Abs): 0 10*3/uL (ref 0.0–0.1)
Immature Granulocytes: 0 %
Lymphocytes Absolute: 3.9 10*3/uL — ABNORMAL HIGH (ref 0.7–3.1)
Lymphs: 44 %
MCH: 32 pg (ref 26.6–33.0)
MCHC: 33.5 g/dL (ref 31.5–35.7)
MCV: 96 fL (ref 79–97)
Monocytes Absolute: 0.6 10*3/uL (ref 0.1–0.9)
Monocytes: 6 %
Neutrophils Absolute: 4.2 10*3/uL (ref 1.4–7.0)
Neutrophils: 46 %
Platelets: 364 10*3/uL (ref 150–450)
RBC: 4.25 x10E6/uL (ref 3.77–5.28)
RDW: 13.1 % (ref 11.7–15.4)
WBC: 9.1 10*3/uL (ref 3.4–10.8)

## 2019-08-18 LAB — CMP14+EGFR
ALT: 13 IU/L (ref 0–32)
AST: 18 IU/L (ref 0–40)
Albumin/Globulin Ratio: 2 (ref 1.2–2.2)
Albumin: 4.3 g/dL (ref 3.8–4.9)
Alkaline Phosphatase: 103 IU/L (ref 39–117)
BUN/Creatinine Ratio: 13 (ref 9–23)
BUN: 10 mg/dL (ref 6–24)
Bilirubin Total: <0.2 mg/dL (ref 0.0–1.2)
CO2: 24 mmol/L (ref 20–29)
Calcium: 9.8 mg/dL (ref 8.7–10.2)
Chloride: 105 mmol/L (ref 96–106)
Creatinine, Ser: 0.77 mg/dL (ref 0.57–1.00)
GFR calc Af Amer: 98 mL/min/{1.73_m2} (ref >59)
GFR calc non Af Amer: 85 mL/min/{1.73_m2} (ref >59)
Globulin, Total: 2.2 g/dL (ref 1.5–4.5)
Glucose: 59 mg/dL — ABNORMAL LOW (ref 65–99)
Potassium: 4.5 mmol/L (ref 3.5–5.2)
Sodium: 143 mmol/L (ref 134–144)
Total Protein: 6.5 g/dL (ref 6.0–8.5)

## 2019-08-18 LAB — VITAMIN D 25 HYDROXY (VIT D DEFICIENCY, FRACTURES): Vit D, 25-Hydroxy: 8.9 ng/mL — ABNORMAL LOW (ref 30.0–100.0)

## 2019-08-18 LAB — LIPID PANEL
Chol/HDL Ratio: 6 ratio — ABNORMAL HIGH (ref 0.0–4.4)
Cholesterol, Total: 233 mg/dL — ABNORMAL HIGH (ref 100–199)
HDL: 39 mg/dL — ABNORMAL LOW (ref >39)
LDL Chol Calc (NIH): 169 mg/dL — ABNORMAL HIGH (ref 0–99)
Triglycerides: 138 mg/dL (ref 0–149)
VLDL Cholesterol Cal: 25 mg/dL (ref 5–40)

## 2019-08-20 ENCOUNTER — Other Ambulatory Visit: Payer: Self-pay | Admitting: Family

## 2019-08-20 MED ORDER — VITAMIN D (ERGOCALCIFEROL) 1.25 MG (50000 UNIT) PO CAPS: 50000.0000 [IU] | ORAL_CAPSULE | ORAL | 3 refills | Status: DC

## 2019-08-22 LAB — TOXASSURE SELECT 13 (MW), URINE

## 2019-12-07 ENCOUNTER — Other Ambulatory Visit: Payer: Self-pay | Admitting: Family

## 2019-12-07 DIAGNOSIS — G43009 Migraine without aura, not intractable, without status migrainosus: Secondary | ICD-10-CM

## 2019-12-28 ENCOUNTER — Ambulatory Visit (INDEPENDENT_AMBULATORY_CARE_PROVIDER_SITE_OTHER)
Admission: RE | Admit: 2019-12-28 | Discharge: 2019-12-28 | Disposition: A | Discharge status: Home / Self Care | Payer: 59 | Source: Ambulatory Visit

## 2019-12-28 ENCOUNTER — Other Ambulatory Visit: Payer: Self-pay

## 2019-12-28 DIAGNOSIS — R21 Rash and other nonspecific skin eruption: Secondary | ICD-10-CM

## 2019-12-28 DIAGNOSIS — W57XXXA Bitten or stung by nonvenomous insect and other nonvenomous arthropods, initial encounter: Secondary | ICD-10-CM | POA: Diagnosis not present

## 2019-12-28 NOTE — Discharge Instructions (Addendum)
See the attached information about tick bites.    Come to the Urgent Care if you have fever, increase redness, rash, or other concerning symptoms.    Follow up with your primary care provider to discuss your migraine headaches.

## 2019-12-28 NOTE — ED Provider Notes (Addendum)
Virtual Visit via Video Note:  Theresa Pham  initiated request for Telemedicine visit with [facility] Urgent Care team. I connected with Theresa Pham  on 12/28/2019 at 1:43 PM  for a synchronized telemedicine visit using a video enabled HIPPA compliant telemedicine application. I verified that I am speaking with Theresa Pham  using two identifiers. Theresa Balloon, NP  was physically located in a [facility] Urgent care site and Theresa Pham was located at a different location.   The limitations of evaluation and management by telemedicine as well as the availability of in-person appointments were discussed. Patient was informed that she  may incur a bill ( including co-pay) for this virtual visit encounter. Theresa Pham  expressed understanding and gave verbal consent to proceed with virtual visit.     History of Present Illness:Theresa Pham  is a 59 y.o. female presents for evaluation of headache x 3 days.  She also reports she found a tick on the back of her left knee today; she removed the tick intact; The tick bite is red but no bullseye rash, other rash, fever, or other symptoms.   Patient has a history of migraines.   Allergies  Allergen Reactions  . Ampicillin Rash  . Azithromycin Diarrhea  . Penicillins Rash  . Erythromycin   . Other     CORNSTARCH -MIGRAINES  . Codeine Nausea Only     Past Medical History:  Diagnosis Date  . Anemia    hx of  . Anxiety   . Arthritis   . Headache(784.0)    migraines, frequent  . History of kidney stones 1993  . Hx of migraine headaches   . Hyperlipidemia   . IBS (irritable bowel syndrome)    hx of, no problems  . TMJ (dislocation of temporomandibular joint)    hx of     Social History   Tobacco Use  . Smoking status: Former Smoker    Years: 7.00    Types: Cigarettes    Quit date: 08/09/1978    Years since quitting: 41.4  . Smokeless tobacco: Never Used  Substance Use Topics  . Alcohol use: No   Alcohol/week: 0.0 standard drinks    Comment: rare  . Drug use: No   ROS: as stated in HPI.  All other systems reviewed and negative.      Observations/Objective: Physical Exam  VITALS: Patient denies fever. GENERAL: Alert, appears well and in no acute distress. HEENT: Atraumatic. NECK: Normal movements of the head and neck. CARDIOPULMONARY: No increased WOB. Speaking in clear sentences. I:E ratio WNL.  MS: Moves all visible extremities without noticeable abnormality. PSYCH: Pleasant and cooperative, well-groomed. Speech normal rate and rhythm. Affect is appropriate. Insight and judgement are appropriate. Attention is focused, linear, and appropriate.  NEURO: CN grossly intact. Oriented as arrived to appointment on time with no prompting. Moves both UE equally.  SKIN: 1 cm area of redness on left popliteal area.    Assessment and Plan:    ICD-10-CM   1. Rash  R21   2. Tick bite, initial encounter  W57.XXXA        Follow Up Instructions: Localized erythema at site of tick bite.  No bullseye rash, other rash, or fever.  Education provided about tick bourne illnesses.  Instructed patient to come to the urgent care if she has symptoms of tickborne illness.  Instructed her to follow-up with her PCP regarding her ongoing migraine headaches.  Patient agrees to  plan of care.    I discussed the assessment and treatment plan with the patient. The patient was provided an opportunity to ask questions and all were answered. The patient agreed with the plan and demonstrated an understanding of the instructions.   The patient was advised to call back or seek an in-person evaluation if the symptoms worsen or if the condition fails to improve as anticipated.      Theresa Balloon, NP  12/28/2019 1:43 PM         Theresa Balloon, NP 12/28/19 1342    Theresa Balloon, NP 12/28/19 1343

## 2020-02-28 ENCOUNTER — Other Ambulatory Visit: Payer: Self-pay | Admitting: Family

## 2020-02-28 DIAGNOSIS — G43009 Migraine without aura, not intractable, without status migrainosus: Secondary | ICD-10-CM

## 2020-02-28 NOTE — Telephone Encounter (Signed)
1 refill given today-ntbs for further refills

## 2020-03-10 ENCOUNTER — Other Ambulatory Visit: Payer: Self-pay | Admitting: Family

## 2020-03-10 DIAGNOSIS — F411 Generalized anxiety disorder: Secondary | ICD-10-CM

## 2020-03-10 DIAGNOSIS — Z79899 Other long term (current) drug therapy: Secondary | ICD-10-CM

## 2020-03-10 DIAGNOSIS — F1320 Sedative, hypnotic or anxiolytic dependence, uncomplicated: Secondary | ICD-10-CM

## 2020-03-14 ENCOUNTER — Other Ambulatory Visit: Payer: Self-pay | Admitting: Family

## 2020-03-14 DIAGNOSIS — Z79899 Other long term (current) drug therapy: Secondary | ICD-10-CM

## 2020-03-14 DIAGNOSIS — F1320 Sedative, hypnotic or anxiolytic dependence, uncomplicated: Secondary | ICD-10-CM

## 2020-03-14 DIAGNOSIS — F411 Generalized anxiety disorder: Secondary | ICD-10-CM

## 2020-03-14 MED ORDER — DIAZEPAM 5 MG PO TABS: ORAL_TABLET | ORAL | 0 refills | Status: DC

## 2020-03-18 ENCOUNTER — Other Ambulatory Visit: Payer: Self-pay | Admitting: Family

## 2020-03-18 DIAGNOSIS — G43009 Migraine without aura, not intractable, without status migrainosus: Secondary | ICD-10-CM

## 2020-03-25 ENCOUNTER — Encounter: Payer: Self-pay | Admitting: Family

## 2020-03-25 ENCOUNTER — Ambulatory Visit: Payer: PRIVATE HEALTH INSURANCE | Admitting: Family

## 2020-03-25 ENCOUNTER — Other Ambulatory Visit: Payer: Self-pay

## 2020-03-25 VITALS — BP 123/78 | HR 84 | Temp 98.1°F | Ht 67.5 in | Wt 143.2 lb

## 2020-03-25 DIAGNOSIS — G43009 Migraine without aura, not intractable, without status migrainosus: Secondary | ICD-10-CM | POA: Diagnosis not present

## 2020-03-25 DIAGNOSIS — E559 Vitamin D deficiency, unspecified: Secondary | ICD-10-CM

## 2020-03-25 DIAGNOSIS — E782 Mixed hyperlipidemia: Secondary | ICD-10-CM

## 2020-03-25 DIAGNOSIS — F411 Generalized anxiety disorder: Secondary | ICD-10-CM

## 2020-03-25 DIAGNOSIS — F1320 Sedative, hypnotic or anxiolytic dependence, uncomplicated: Secondary | ICD-10-CM

## 2020-03-25 DIAGNOSIS — Z79899 Other long term (current) drug therapy: Secondary | ICD-10-CM | POA: Diagnosis not present

## 2020-03-25 MED ORDER — DIAZEPAM 5 MG PO TABS: 5.0000 mg | ORAL_TABLET | Freq: Two times a day (BID) | ORAL | 5 refills | Status: DC | PRN

## 2020-03-25 NOTE — Progress Notes (Signed)
Subjective:    Patient ID: Theresa Pham, female    DOB: 14-Aug-1960, 59 y.o.   MRN: [id number]  Chief Complaint  Patient presents with  . Headache    wants referral to [facility]   Pt calls the office today for chronic follow up. She is requesting her Valium refilled Headache  This is a new problem. The current episode started more than 1 month ago. The problem occurs daily. The problem has been gradually worsening. The pain is located in the right unilateral region. The pain quality is similar to prior headaches. The quality of the pain is described as aching. The pain is at a severity of 10/10. The pain is moderate. Associated symptoms include nausea, phonophobia and photophobia. Pertinent negatives include no vomiting. The symptoms are aggravated by emotional stress. She has tried darkened room, acetaminophen and triptans for the symptoms. The treatment provided mild relief. Her past medical history is significant for obesity.  Hyperlipidemia This is a chronic problem. The current episode started more than 1 year ago. The problem is uncontrolled. Recent lipid tests were reviewed and are high. Exacerbating diseases include obesity. She is currently on no antihyperlipidemic treatment. The current treatment provides no improvement of lipids. Risk factors for coronary artery disease include family history, hypertension, post-menopausal and a sedentary lifestyle.  Anxiety Presents for follow-up visit. Symptoms include excessive worry, irritability, nausea, panic and restlessness. Symptoms occur occasionally. The severity of symptoms is moderate. The quality of sleep is good.        Review of Systems  Constitutional: Positive for irritability.  Eyes: Positive for photophobia.  Gastrointestinal: Positive for nausea. Negative for vomiting.  Neurological: Positive for headaches.  All other systems reviewed and are negative.      Objective:   Physical Exam Vitals reviewed.    Constitutional:      General: She is not in acute distress.    Appearance: She is well-developed.  HENT:     Head: Normocephalic and atraumatic.     Right Ear: External ear normal.  Eyes:     Pupils: Pupils are equal, round, and reactive to light.  Neck:     Thyroid: No thyromegaly.  Cardiovascular:     Rate and Rhythm: Normal rate and regular rhythm.     Heart sounds: Normal heart sounds. No murmur heard.   Pulmonary:     Effort: Pulmonary effort is normal. No respiratory distress.     Breath sounds: Normal breath sounds. No wheezing.  Abdominal:     General: Bowel sounds are normal. There is no distension.     Palpations: Abdomen is soft.     Tenderness: There is no abdominal tenderness.  Musculoskeletal:        General: No tenderness. Normal range of motion.     Cervical back: Normal range of motion and neck supple.  Skin:    General: Skin is warm and dry.  Neurological:     Mental Status: She is alert and oriented to person, place, and time.     Cranial Nerves: No cranial nerve deficit.     Deep Tendon Reflexes: Reflexes are normal and symmetric.  Psychiatric:        Behavior: Behavior normal.        Thought Content: Thought content normal.        Judgment: Judgment normal.       BP 123/78   Pulse 84   Temp 98.1 F (36.7 C) (Temporal)   Ht 5' 7.5" (1.715  m)   Wt 143 lb 3.2 oz (65 kg)   SpO2 95%   BMI 22.10 kg/m      Assessment & Plan:  KIDADA GING comes in today with chief complaint of Headache (wants referral to [facility])   Diagnosis and orders addressed:  1. Migraine without aura and without status migrainosus, not intractable -Discussed she needs to stop Goodys powder She can not tolerate Topamax Referral to Neurologists pending  - Ambulatory referral to Neurology - CMP14+EGFR - CBC with Differential/Platelet  2. Benzodiazepine dependence ([place]) - Ambulatory referral to Neurology - CMP14+EGFR - CBC with Differential/Platelet -  diazepam (VALIUM) 5 MG tablet; Take 1 tablet (5 mg total) by mouth every 12 (twelve) hours as needed for anxiety. TAKE 1 TABLET EVERY 8 HOURS AS NEEDED FOR ANXIETY  Dispense: 60 tablet; Refill: 5  3. Controlled substance agreement signed - Ambulatory referral to Neurology - CMP14+EGFR - CBC with Differential/Platelet - diazepam (VALIUM) 5 MG tablet; Take 1 tablet (5 mg total) by mouth every 12 (twelve) hours as needed for anxiety. TAKE 1 TABLET EVERY 8 HOURS AS NEEDED FOR ANXIETY  Dispense: 60 tablet; Refill: 5  4. Generalized anxiety disorder - CMP14+EGFR - CBC with Differential/Platelet - diazepam (VALIUM) 5 MG tablet; Take 1 tablet (5 mg total) by mouth every 12 (twelve) hours as needed for anxiety. TAKE 1 TABLET EVERY 8 HOURS AS NEEDED FOR ANXIETY  Dispense: 60 tablet; Refill: 5  5. Mixed hyperlipidemia - CMP14+EGFR - CBC with Differential/Platelet  6. Vitamin D deficiency - CMP14+EGFR - CBC with Differential/Platelet   Labs pending Patient reviewed in [place] controlled database, no flags noted. Contract and drug screen are up to date.  Health Maintenance reviewed Diet and exercise encouraged  Follow up plan: 6 months    Evelina Dun, FNP

## 2020-03-25 NOTE — Patient Instructions (Signed)
Health Maintenance, Female Adopting a healthy lifestyle and getting preventive care are important in promoting health and wellness. Ask your health care provider about:  The right schedule for you to have regular tests and exams.  Things you can do on your own to prevent diseases and keep yourself healthy. What should I know about diet, weight, and exercise? Eat a healthy diet   Eat a diet that includes plenty of vegetables, fruits, low-fat dairy products, and lean protein.  Do not eat a lot of foods that are high in solid fats, added sugars, or sodium. Maintain a healthy weight Body mass index (BMI) is used to identify weight problems. It estimates body fat based on height and weight. Your health care provider can help determine your BMI and help you achieve or maintain a healthy weight. Get regular exercise Get regular exercise. This is one of the most important things you can do for your health. Most adults should:  Exercise for at least 150 minutes each week. The exercise should increase your heart rate and make you sweat (moderate-intensity exercise).  Do strengthening exercises at least twice a week. This is in addition to the moderate-intensity exercise.  Spend less time sitting. Even light physical activity can be beneficial. Watch cholesterol and blood lipids Have your blood tested for lipids and cholesterol at 60 years of age, then have this test every 5 years. Have your cholesterol levels checked more often if:  Your lipid or cholesterol levels are high.  You are older than 60 years of age.  You are at high risk for heart disease. What should I know about cancer screening? Depending on your health history and family history, you may need to have cancer screening at various ages. This may include screening for:  Breast cancer.  Cervical cancer.  Colorectal cancer.  Skin cancer.  Lung cancer. What should I know about heart disease, diabetes, and high blood  pressure? Blood pressure and heart disease  High blood pressure causes heart disease and increases the risk of stroke. This is more likely to develop in people who have high blood pressure readings, are of African descent, or are overweight.  Have your blood pressure checked: ? Every 3-5 years if you are 18-39 years of age. ? Every year if you are 40 years old or older. Diabetes Have regular diabetes screenings. This checks your fasting blood sugar level. Have the screening done:  Once every three years after age 40 if you are at a normal weight and have a low risk for diabetes.  More often and at a younger age if you are overweight or have a high risk for diabetes. What should I know about preventing infection? Hepatitis B If you have a higher risk for hepatitis B, you should be screened for this virus. Talk with your health care provider to find out if you are at risk for hepatitis B infection. Hepatitis C Testing is recommended for:  Everyone born from 1945 through 1965.  Anyone with known risk factors for hepatitis C. Sexually transmitted infections (STIs)  Get screened for STIs, including gonorrhea and chlamydia, if: ? You are sexually active and are younger than 60 years of age. ? You are older than 60 years of age and your health care provider tells you that you are at risk for this type of infection. ? Your sexual activity has changed since you were last screened, and you are at increased risk for chlamydia or gonorrhea. Ask your health care provider if   you are at risk.  Ask your health care provider about whether you are at high risk for HIV. Your health care provider may recommend a prescription medicine to help prevent HIV infection. If you choose to take medicine to prevent HIV, you should first get tested for HIV. You should then be tested every 3 months for as long as you are taking the medicine. Pregnancy  If you are about to stop having your period (premenopausal) and  you may become pregnant, seek counseling before you get pregnant.  Take 400 to 800 micrograms (mcg) of folic acid every day if you become pregnant.  Ask for birth control (contraception) if you want to prevent pregnancy. Osteoporosis and menopause Osteoporosis is a disease in which the bones lose minerals and strength with aging. This can result in bone fractures. If you are 65 years old or older, or if you are at risk for osteoporosis and fractures, ask your health care provider if you should:  Be screened for bone loss.  Take a calcium or vitamin D supplement to lower your risk of fractures.  Be given hormone replacement therapy (HRT) to treat symptoms of menopause. Follow these instructions at home: Lifestyle  Do not use any products that contain nicotine or tobacco, such as cigarettes, e-cigarettes, and chewing tobacco. If you need help quitting, ask your health care provider.  Do not use street drugs.  Do not share needles.  Ask your health care provider for help if you need support or information about quitting drugs. Alcohol use  Do not drink alcohol if: ? Your health care provider tells you not to drink. ? You are pregnant, may be pregnant, or are planning to become pregnant.  If you drink alcohol: ? Limit how much you use to 0-1 drink a day. ? Limit intake if you are breastfeeding.  Be aware of how much alcohol is in your drink. In the [place] one drink equals one 12 oz bottle of beer (355 mL), one 5 oz glass of wine (148 mL), or one 1 oz glass of hard liquor (44 mL). General instructions  Schedule regular health, dental, and eye exams.  Stay current with your vaccines.  Tell your health care provider if: ? You often feel depressed. ? You have ever been abused or do not feel safe at home. Summary  Adopting a healthy lifestyle and getting preventive care are important in promoting health and wellness.  Follow your health care provider's instructions about healthy  diet, exercising, and getting tested or screened for diseases.  Follow your health care provider's instructions on monitoring your cholesterol and blood pressure. This information is not intended to replace advice given to you by your health care provider. Make sure you discuss any questions you have with your health care provider. Document Revised: 07/19/2018 Document Reviewed: 07/19/2018 Elsevier Patient Education  2020 [employer]  

## 2020-03-26 LAB — CBC WITH DIFFERENTIAL/PLATELET
Basophils Absolute: 0.1 10*3/uL (ref 0.0–0.2)
Basos: 1 %
EOS (ABSOLUTE): 0.3 10*3/uL (ref 0.0–0.4)
Eos: 3 %
Hematocrit: 38.7 % (ref 34.0–46.6)
Hemoglobin: 13.5 g/dL (ref 11.1–15.9)
Immature Grans (Abs): 0 10*3/uL (ref 0.0–0.1)
Immature Granulocytes: 0 %
Lymphocytes Absolute: 4.1 10*3/uL — ABNORMAL HIGH (ref 0.7–3.1)
Lymphs: 41 %
MCH: 32.9 pg (ref 26.6–33.0)
MCHC: 34.9 g/dL (ref 31.5–35.7)
MCV: 94 fL (ref 79–97)
Monocytes Absolute: 0.6 10*3/uL (ref 0.1–0.9)
Monocytes: 6 %
Neutrophils Absolute: 4.9 10*3/uL (ref 1.4–7.0)
Neutrophils: 49 %
Platelets: 351 10*3/uL (ref 150–450)
RBC: 4.1 x10E6/uL (ref 3.77–5.28)
RDW: 13.9 % (ref 11.7–15.4)
WBC: 9.9 10*3/uL (ref 3.4–10.8)

## 2020-03-26 LAB — CMP14+EGFR
ALT: 12 IU/L (ref 0–32)
AST: 13 IU/L (ref 0–40)
Albumin/Globulin Ratio: 1.8 (ref 1.2–2.2)
Albumin: 4.4 g/dL (ref 3.8–4.9)
Alkaline Phosphatase: 102 IU/L (ref 48–121)
BUN/Creatinine Ratio: 17 (ref 12–28)
BUN: 12 mg/dL (ref 8–27)
Bilirubin Total: <0.2 mg/dL (ref 0.0–1.2)
CO2: 21 mmol/L (ref 20–29)
Calcium: 9.6 mg/dL (ref 8.7–10.3)
Chloride: 105 mmol/L (ref 96–106)
Creatinine, Ser: 0.69 mg/dL (ref 0.57–1.00)
GFR calc Af Amer: 109 mL/min/{1.73_m2} (ref >59)
GFR calc non Af Amer: 95 mL/min/{1.73_m2} (ref >59)
Globulin, Total: 2.5 g/dL (ref 1.5–4.5)
Glucose: 76 mg/dL (ref 65–99)
Potassium: 4.3 mmol/L (ref 3.5–5.2)
Sodium: 140 mmol/L (ref 134–144)
Total Protein: 6.9 g/dL (ref 6.0–8.5)

## 2020-04-07 ENCOUNTER — Other Ambulatory Visit: Payer: Self-pay | Admitting: Family

## 2020-04-07 DIAGNOSIS — G43009 Migraine without aura, not intractable, without status migrainosus: Secondary | ICD-10-CM

## 2020-05-05 ENCOUNTER — Other Ambulatory Visit: Payer: Self-pay | Admitting: Family

## 2020-05-05 DIAGNOSIS — G43009 Migraine without aura, not intractable, without status migrainosus: Secondary | ICD-10-CM

## 2020-06-04 ENCOUNTER — Ambulatory Visit: Payer: PRIVATE HEALTH INSURANCE | Admitting: Neurology

## 2020-06-04 ENCOUNTER — Other Ambulatory Visit: Payer: Self-pay

## 2020-06-04 ENCOUNTER — Other Ambulatory Visit: Payer: Self-pay | Admitting: Family

## 2020-06-04 ENCOUNTER — Encounter: Payer: Self-pay | Admitting: Neurology

## 2020-06-04 VITALS — BP 126/76 | HR 81 | Ht 68.0 in | Wt 144.0 lb

## 2020-06-04 DIAGNOSIS — G43009 Migraine without aura, not intractable, without status migrainosus: Secondary | ICD-10-CM | POA: Diagnosis not present

## 2020-06-04 DIAGNOSIS — F411 Generalized anxiety disorder: Secondary | ICD-10-CM

## 2020-06-04 MED ORDER — PROPRANOLOL HCL 20 MG PO TABS
ORAL_TABLET | ORAL | 3 refills | Status: DC
Start: 2020-06-04 — End: 2020-10-01

## 2020-06-04 MED ORDER — DICLOFENAC POTASSIUM 50 MG PO TABS: 50.0000 mg | ORAL_TABLET | Freq: Three times a day (TID) | ORAL | 3 refills | Status: DC | PRN

## 2020-06-04 NOTE — Progress Notes (Signed)
Reason for visit: Headache  Referring physician: Dr. Kem Parkinson is a 59 y.o. female  History of present illness:  Theresa Pham is a 59 year old right-handed white female with a longstanding history of migraine headaches.  The patient was last seen through this office in 2016.  In the past, she has not tolerated the Topamax, she has been on Effexor but this has not been fully effective in controlling her headaches.  The patient has been taking in large amounts of caffeinated products over the years, initially she was drinking 2 L of [facility] a day, she is cut back to 50 ounces a day, and she takes [facility] powders frequently.  The patient currently is having 3-4 headaches a week, she may take 2 or 3 BC powders on the days that she has the headache.  Her headaches may include the entire head, or there may be unilateral in nature.  They may occasionally associated with nausea and vomiting and are associated with photophobia and phonophobia.  Odors of any sort may be bothersome to her during the headache.  She may have occasional flashes of light in the vision.  She reports no focal numbness or weakness of the face, arms, legs.  She has difficulty concentrating with a headache.  She may miss 3 to 4 days of work a month because of the migraine, she does have FMLA at work.  Occasionally she may have some numbness of the left arm when she wakes up in the morning, she has some chronic neck pain as well.  She has low back pain issues as well.  She takes Imitrex if needed, this is effective in treating her headache.  The patient comes here for further evaluation.  She always apparently has headache on the weekends.  Past Medical History:  Diagnosis Date   Anemia    hx of   Anxiety    Arthritis    Headache(784.0)    migraines, frequent   History of kidney stones 1993   Hx of migraine headaches    Hyperlipidemia    IBS (irritable bowel syndrome)    hx of, no problems   TMJ  (dislocation of temporomandibular joint)    hx of    Past Surgical History:  Procedure Laterality Date   CHOLECYSTECTOMY N/A 06/22/2013   Procedure: LAPAROSCOPIC CHOLECYSTECTOMY;  Surgeon: Ralene Ok, MD;  Location: [facility];  Service: General;  Laterality: N/A;   LASIK  2006, 2012   [place]  2002   has her right ovary    Family History  Problem Relation Age of Onset   Hypertension Mother    Uterine cancer Mother    Cancer Mother        uterine   Migraines Mother    Heart attack Father 69   Migraines Daughter    Breast cancer Maternal Aunt    Cancer Maternal Aunt        breast   Colon cancer Neg Hx     Social history:  reports that she quit smoking about 41 years ago. Her smoking use included cigarettes. She quit after 7.00 years of use. She has never used smokeless tobacco. She reports that she does not drink alcohol and does not use drugs.  Medications:  Prior to Admission medications   Medication Sig Start Date End Date Taking? Authorizing Provider  Aspirin-Acetaminophen-Caffeine (GOODY HEADACHE PO) Take 1 packet by mouth every 6 (six) hours as needed (  HEADACHES).    Yes [provider]  Cholecalciferol (VITAMIN D) 50 MCG (2000 UT) CAPS  01/08/20  Yes [provider]  diazepam (VALIUM) 5 MG tablet Take 1 tablet (5 mg total) by mouth every 12 (twelve) hours as needed for anxiety. TAKE 1 TABLET EVERY 8 HOURS AS NEEDED FOR ANXIETY 03/25/20  Yes Hawks, Christy A, FNP  diphenhydrAMINE (BENADRYL) 25 MG tablet Take 25 mg by mouth every 6 (six) hours as needed for allergies or sleep.   Yes [provider]  magnesium 30 MG tablet Take 30 mg by mouth 2 (two) times daily.   Yes [provider]  SUMAtriptan (IMITREX) 100 MG tablet TAKE 1 TABLET AT ONSET OF MIGRAINE HEADACHE MAY REPEAT ONCE IN 2 HOURS (MAX OF 2 TABLETS/24 HOURS) 05/05/20  Yes Hawks, Christy A, FNP  venlafaxine XR (EFFEXOR-XR) 150  MG 24 hr capsule TAKE 1 CAPSULE EVERY MORNING WITH BREAKFAST (Needs to be seen before next refill) 08/17/19  Yes Hawks, Christy A, FNP  vitamin B-12 (CYANOCOBALAMIN) 1000 MCG tablet Take 1,000 mcg by mouth daily.   Yes [provider]      Allergies  Allergen Reactions   Ampicillin Rash   Azithromycin Diarrhea   Penicillins Rash   Erythromycin    Other     CORNSTARCH -MIGRAINES   Codeine Nausea Only    ROS:  Out of a complete 14 system review of symptoms, the patient complains only of the following symptoms, and all other reviewed systems are negative.  Headache Neck pain, low back pain  Blood pressure 126/76, pulse 81, height 5\' 8"  (1.727 m), weight 144 lb (65.3 kg).  Physical Exam  General: The patient is alert and cooperative at the time of the examination.  Eyes: Pupils are equal, round, and reactive to light. Discs are flat bilaterally.  Neck: The neck is supple, no carotid bruits are noted.  Respiratory: The respiratory examination is clear.  Cardiovascular: The cardiovascular examination reveals a regular rate and rhythm, no obvious murmurs or rubs are noted.  Skin: Extremities are without significant edema.  Neurologic Exam  Mental status: The patient is alert and oriented x 3 at the time of the examination. The patient has apparent normal recent and remote memory, with an apparently normal attention span and concentration ability.  Cranial nerves: Facial symmetry is present. There is good sensation of the face to pinprick and soft touch bilaterally. The strength of the facial muscles and the muscles to head turning and shoulder shrug are normal bilaterally. Speech is well enunciated, no aphasia or dysarthria is noted. Extraocular movements are full. Visual fields are full. The tongue is midline, and the patient has symmetric elevation of the soft palate. No obvious hearing deficits are noted.  Motor: The motor testing reveals 5 over 5 strength of all  4 extremities. Good symmetric motor tone is noted throughout.  Sensory: Sensory testing is intact to pinprick, soft touch, vibration sensation, and position sense on all 4 extremities. No evidence of extinction is noted.  Coordination: Cerebellar testing reveals good finger-nose-finger and heel-to-shin bilaterally.  Gait and station: Gait is normal. Tandem gait is normal. Romberg is negative. No drift is seen.  Reflexes: Deep tendon reflexes are symmetric and normal bilaterally. Toes are downgoing bilaterally.   Assessment/Plan:  1.  Intractable migraine headache  The patient continues to have frequent headache events.  The patient will be tried on propranolol 20 mg twice daily for 2 weeks and then go to 40 mg twice  daily.  She is to stop the [facility] powders, we will give her diclofenac potassium to take if needed.  The patient may also use Imitrex if needed.  She will follow up here in 4 months.  If the propranolol is not effective, we may consider use of one of the newer medications such as Aimovig or Ajovy.  Jill Alexanders MD 06/04/2020 9:48 AM  [facility] [address] [place], [place] [address]  Phone [phone] Fax [phone]

## 2020-06-04 NOTE — Patient Instructions (Signed)
We will start propranolol for migraine prevention. Stop the [facility] powders, use diclofenac potassium 50 mg if needed for the headache.  Inderal (propranolol) is a blood pressure medication that is commonly used for migraine headaches. This is a type of beta blocker. The most common side effects include low heart rate, dizziness, fatigue, and increased depression. This medication may worsen asthma. If you believe that you are having side effects on this medication, please contact our office.

## 2020-06-26 ENCOUNTER — Other Ambulatory Visit: Payer: Self-pay | Admitting: Family

## 2020-06-26 DIAGNOSIS — G43009 Migraine without aura, not intractable, without status migrainosus: Secondary | ICD-10-CM

## 2020-08-19 ENCOUNTER — Ambulatory Visit (INDEPENDENT_AMBULATORY_CARE_PROVIDER_SITE_OTHER): Payer: PRIVATE HEALTH INSURANCE | Admitting: Nurse Practitioner

## 2020-08-19 ENCOUNTER — Encounter: Payer: Self-pay | Admitting: Nurse Practitioner

## 2020-08-19 DIAGNOSIS — Z20822 Contact with and (suspected) exposure to covid-19: Secondary | ICD-10-CM | POA: Diagnosis not present

## 2020-08-19 NOTE — Progress Notes (Signed)
Patient ID: Theresa Pham, female   DOB: [birth year], 60 y.o.   MRN: [id number]   Attempted to call and left message at 9:15 AM Attempted to call an dleft message at 9:58 AM]    Virtual Visit via telephone Note Due to COVID-19 pandemic this visit was conducted virtually. This visit type was conducted due to national recommendations for restrictions regarding the COVID-19 Pandemic (e.g. social distancing, sheltering in place) in an effort to limit this patient's exposure and mitigate transmission in our community. All issues noted in this document were discussed and addressed.  A physical exam was not performed with this format.  I connected with Theresa Pham on 08/19/20 at 10:45 by telephone and verified that I am speaking with the correct person using two identifiers. Theresa Pham is currently located at [employer] [address] and no one is currently with  her during visit. The provider, Mary-Margaret Hassell Done, FNP is located in their office at time of visit.  I discussed the limitations, risks, security and privacy concerns of performing an evaluation and management service by telephone and the availability of in person appointments. I also discussed with the patient that there may be a patient responsible charge related to this service. The patient expressed understanding and agreed to proceed.   History and Present Illness:   Chief Complaint: Covid Exposure   HPI Patient tested positive on christmas eve with a home test. She is still symptomatic with cough, congestion and rash. Her work will not lt her come back until she has a negative covid test.  Review of Systems  Constitutional: Negative for chills, fever and malaise/fatigue.  HENT: Positive for congestion. Negative for sinus pain and sore throat.   Respiratory: Positive for cough (slight). Negative for shortness of breath.   Musculoskeletal: Negative for myalgias.  Neurological: Negative for dizziness and headaches.   Psychiatric/Behavioral: Negative.      Observations/Objective: Alert and oriented- answers all questions appropriately No distress Voice hoarse Mild cough noted.  Assessment and Plan: Theresa Pham in today with chief complaint of Covid Exposure   1. COVID-19 ruled out by laboratory testing Will have test results sent immediately to her Continue to quarantine since sym[tomatic - Novel Coronavirus, NAA ([facility]); Future 1. Take meds as prescribed 2. Use a cool mist humidifier especially during the winter months and when heat has been humid. 3. Use saline nose sprays frequently 4. Saline irrigations of the nose can be very helpful if done frequently.  * 4X daily for 1 week*  * Use of a nettie pot can be helpful with this. Follow directions with this* 5. Drink plenty of fluids 6. Keep thermostat turn down low 7.For any cough or congestion  Use plain Mucinex- regular strength or max strength is fine   * Children- consult with Pharmacist for dosing 8. For fever or aces or pains- take tylenol or ibuprofen appropriate for age and weight.  * for fevers greater than 101 orally you may alternate ibuprofen and tylenol every  3 hours.      Follow Up Instructions: prn    I discussed the assessment and treatment plan with the patient. The patient was provided an opportunity to ask questions and all were answered. The patient agreed with the plan and demonstrated an understanding of the instructions.   The patient was advised to call back or seek an in-person evaluation if the symptoms worsen or if the condition fails to improve as anticipated.  The above assessment and management  plan was discussed with the patient. The patient verbalized understanding of and has agreed to the management plan. Patient is aware to call the clinic if symptoms persist or worsen. Patient is aware when to return to the clinic for a follow-up visit. Patient educated on when it is appropriate to go to the  emergency department.   Time call ended:  10:57  I provided 12 minutes of non-face-to-face time during this encounter.    Mary-Margaret Hassell Done, FNP

## 2020-08-19 NOTE — Addendum Note (Signed)
Addended by: Liliane Bade on: 08/19/2020 11:08 AM   Modules accepted: Orders

## 2020-08-21 LAB — SARS-COV-2, NAA 2 DAY TAT

## 2020-08-21 LAB — NOVEL CORONAVIRUS, NAA: SARS-CoV-2, NAA: NOT DETECTED

## 2020-08-26 ENCOUNTER — Other Ambulatory Visit: Payer: Self-pay | Admitting: Family

## 2020-08-26 ENCOUNTER — Other Ambulatory Visit: Payer: Self-pay | Admitting: Neurology

## 2020-08-26 DIAGNOSIS — G43009 Migraine without aura, not intractable, without status migrainosus: Secondary | ICD-10-CM

## 2020-08-26 DIAGNOSIS — F1320 Sedative, hypnotic or anxiolytic dependence, uncomplicated: Secondary | ICD-10-CM

## 2020-08-26 DIAGNOSIS — Z79899 Other long term (current) drug therapy: Secondary | ICD-10-CM

## 2020-08-26 DIAGNOSIS — F411 Generalized anxiety disorder: Secondary | ICD-10-CM

## 2020-09-23 ENCOUNTER — Other Ambulatory Visit: Payer: Self-pay | Admitting: Family

## 2020-09-23 DIAGNOSIS — Z79899 Other long term (current) drug therapy: Secondary | ICD-10-CM

## 2020-09-23 DIAGNOSIS — G43009 Migraine without aura, not intractable, without status migrainosus: Secondary | ICD-10-CM

## 2020-09-23 DIAGNOSIS — F411 Generalized anxiety disorder: Secondary | ICD-10-CM

## 2020-09-23 DIAGNOSIS — F1320 Sedative, hypnotic or anxiolytic dependence, uncomplicated: Secondary | ICD-10-CM

## 2020-09-25 ENCOUNTER — Ambulatory Visit: Payer: PRIVATE HEALTH INSURANCE | Admitting: Family

## 2020-09-25 ENCOUNTER — Encounter: Payer: Self-pay | Admitting: Family

## 2020-09-25 ENCOUNTER — Ambulatory Visit (INDEPENDENT_AMBULATORY_CARE_PROVIDER_SITE_OTHER): Payer: PRIVATE HEALTH INSURANCE | Admitting: Family

## 2020-09-25 ENCOUNTER — Other Ambulatory Visit: Payer: Self-pay

## 2020-09-25 VITALS — BP 128/79 | HR 72 | Temp 96.7°F | Ht 68.0 in | Wt 140.4 lb

## 2020-09-25 DIAGNOSIS — Z79899 Other long term (current) drug therapy: Secondary | ICD-10-CM

## 2020-09-25 DIAGNOSIS — G43009 Migraine without aura, not intractable, without status migrainosus: Secondary | ICD-10-CM

## 2020-09-25 DIAGNOSIS — E559 Vitamin D deficiency, unspecified: Secondary | ICD-10-CM

## 2020-09-25 DIAGNOSIS — E782 Mixed hyperlipidemia: Secondary | ICD-10-CM | POA: Diagnosis not present

## 2020-09-25 DIAGNOSIS — F411 Generalized anxiety disorder: Secondary | ICD-10-CM

## 2020-09-25 DIAGNOSIS — F1320 Sedative, hypnotic or anxiolytic dependence, uncomplicated: Secondary | ICD-10-CM | POA: Diagnosis not present

## 2020-09-25 MED ORDER — VENLAFAXINE HCL ER 150 MG PO CP24
ORAL_CAPSULE | ORAL | 1 refills | Status: DC
Start: 2020-09-25 — End: 2021-06-18

## 2020-09-25 MED ORDER — SUMATRIPTAN SUCCINATE 100 MG PO TABS: ORAL_TABLET | ORAL | 0 refills | Status: DC

## 2020-09-25 MED ORDER — DICLOFENAC POTASSIUM 50 MG PO TABS: 50.0000 mg | ORAL_TABLET | Freq: Three times a day (TID) | ORAL | 3 refills | Status: DC | PRN

## 2020-09-25 MED ORDER — DIAZEPAM 5 MG PO TABS: 5.0000 mg | ORAL_TABLET | Freq: Two times a day (BID) | ORAL | 5 refills | Status: DC | PRN

## 2020-09-25 MED ORDER — BUSPIRONE HCL 5 MG PO TABS: 5.0000 mg | ORAL_TABLET | Freq: Three times a day (TID) | ORAL | 2 refills | Status: DC | PRN

## 2020-09-25 NOTE — Patient Instructions (Signed)
[email or web address].NIH.Gov">  Generalized Anxiety Disorder, Adult Generalized anxiety disorder (GAD) is a mental health condition. Unlike normal worries, anxiety related to GAD is not triggered by a specific event. These worries do not fade or get better with time. GAD interferes with relationships, work, and school. GAD symptoms can vary from mild to severe. People with severe GAD can have intense waves of anxiety with physical symptoms that are similar to panic attacks. What are the causes? The exact cause of GAD is not known, but the following are believed to have an impact:  Differences in natural brain chemicals.  Genes passed down from parents to children.  Differences in the way threats are perceived.  Development during childhood.  Personality. What increases the risk? The following factors may make you more likely to develop this condition:  Being female.  Having a family history of anxiety disorders.  Being very shy.  Experiencing very stressful life events, such as the death of a loved one.  Having a very stressful family environment. What are the signs or symptoms? People with GAD often worry excessively about many things in their lives, such as their health and family. Symptoms may also include:  Mental and emotional symptoms: ? Worrying excessively about natural disasters. ? Fear of being late. ? Difficulty concentrating. ? Fears that others are judging your performance.  Physical symptoms: ? Fatigue. ? Headaches, muscle tension, muscle twitches, trembling, or feeling shaky. ? Feeling like your heart is pounding or beating very fast. ? Feeling out of breath or like you cannot take a deep breath. ? Having trouble falling asleep or staying asleep, or experiencing restlessness. ? Sweating. ? Nausea, diarrhea, or irritable bowel syndrome (IBS).  Behavioral symptoms: ? Experiencing erratic moods or irritability. ? Avoidance of new situations. ? Avoidance of  people. ? Extreme difficulty making decisions. How is this diagnosed? This condition is diagnosed based on your symptoms and medical history. You will also have a physical exam. Your health care provider may perform tests to rule out other possible causes of your symptoms. To be diagnosed with GAD, a person must have anxiety that:  Is out of his or her control.  Affects several different aspects of his or her life, such as work and relationships.  Causes distress that makes him or her unable to take part in normal activities.  Includes at least three symptoms of GAD, such as restlessness, fatigue, trouble concentrating, irritability, muscle tension, or sleep problems. Before your health care provider can confirm a diagnosis of GAD, these symptoms must be present more days than they are not, and they must last for 6 months or longer. How is this treated? This condition may be treated with:  Medicine. Antidepressant medicine is usually prescribed for long-term daily control. Anti-anxiety medicines may be added in severe cases, especially when panic attacks occur.  Talk therapy (psychotherapy). Certain types of talk therapy can be helpful in treating GAD by providing support, education, and guidance. Options include: ? Cognitive behavioral therapy (CBT). People learn coping skills and self-calming techniques to ease their physical symptoms. They learn to identify unrealistic thoughts and behaviors and to replace them with more appropriate thoughts and behaviors. ? Acceptance and commitment therapy (ACT). This treatment teaches people how to be mindful as a way to cope with unwanted thoughts and feelings. ? Biofeedback. This process trains you to manage your body's response (physiological response) through breathing techniques and relaxation methods. You will work with a therapist while machines are used to monitor your physical   symptoms.  Stress management techniques. These include yoga,  meditation, and exercise. A mental health specialist can help determine which treatment is best for you. Some people see improvement with one type of therapy. However, other people require a combination of therapies.   Follow these instructions at home: Lifestyle  Maintain a consistent routine and schedule.  Anticipate stressful situations. Create a plan, and allow extra time to work with your plan.  Practice stress management or self-calming techniques that you have learned from your therapist or your health care provider. General instructions  Take over-the-counter and prescription medicines only as told by your health care provider.  Understand that you are likely to have setbacks. Accept this and be kind to yourself as you persist to take better care of yourself.  Recognize and accept your accomplishments, even if you judge them as small.  Keep all follow-up visits as told by your health care provider. This is important. Contact a health care provider if:  Your symptoms do not get better.  Your symptoms get worse.  You have signs of depression, such as: ? A persistently sad or irritable mood. ? Loss of enjoyment in activities that used to bring you joy. ? Change in weight or eating. ? Changes in sleeping habits. ? Avoiding friends or family members. ? Loss of energy for normal tasks. ? Feelings of guilt or worthlessness. Get help right away if:  You have serious thoughts about hurting yourself or others. If you ever feel like you may hurt yourself or others, or have thoughts about taking your own life, get help right away. Go to your nearest emergency department or:  Call your local emergency services (911 in the [place]  Call a suicide crisis helpline, such as the National Suicide Prevention Lifeline at 1-800-273-8255. This is open 24 hours a day in the [place]  Text the Crisis Text Line at 741741 (in the [place] Summary  Generalized anxiety disorder (GAD) is a mental  health condition that involves worry that is not triggered by a specific event.  People with GAD often worry excessively about many things in their lives, such as their health and family.  GAD may cause symptoms such as restlessness, trouble concentrating, sleep problems, frequent sweating, nausea, diarrhea, headaches, and trembling or muscle twitching.  A mental health specialist can help determine which treatment is best for you. Some people see improvement with one type of therapy. However, other people require a combination of therapies. This information is not intended to replace advice given to you by your health care provider. Make sure you discuss any questions you have with your health care provider. Document Revised: 05/16/2019 Document Reviewed: 05/16/2019 Elsevier Patient Education  2021 Elsevier Inc.  

## 2020-09-25 NOTE — Progress Notes (Signed)
Subjective:    Patient ID: Theresa Pham, female    DOB: 1961/06/05, 60 y.o.   MRN: [id number]  Chief Complaint  Patient presents with  . Medical Management of Chronic Issues  . Anxiety    Pt calls the office today for chronic follow up. She is requesting her Valium refilled. She is followed by Neurologists every 4 months for migraines. She was started on propranolol 40 mg BID that has greatly helped with her headaches.  Anxiety Presents for follow-up visit. Symptoms include depressed mood, excessive worry and palpitations. Patient reports no nausea. Symptoms occur most days. The severity of symptoms is moderate.    Headache  This is a chronic problem. The current episode started more than 1 year ago. The problem occurs intermittently. The problem has been waxing and waning. The pain is located in the right unilateral region. The pain quality is similar to prior headaches. Associated symptoms include phonophobia and photophobia. Pertinent negatives include no nausea. She has tried beta blockers and Excedrin for the symptoms. The treatment provided moderate relief. Her past medical history is significant for obesity.  Hyperlipidemia This is a chronic problem. The current episode started more than 1 year ago. The problem is uncontrolled. Recent lipid tests were reviewed and are high. Exacerbating diseases include obesity. She is currently on no antihyperlipidemic treatment. The current treatment provides no improvement of lipids. Risk factors for coronary artery disease include dyslipidemia, a sedentary lifestyle and post-menopausal.      Review of Systems  Eyes: Positive for photophobia.  Cardiovascular: Positive for palpitations.  Gastrointestinal: Negative for nausea.  Neurological: Positive for headaches.  All other systems reviewed and are negative.      Objective:   Physical Exam Vitals reviewed.  Constitutional:      General: She is not in acute distress.    Appearance:  She is well-developed and well-nourished.  HENT:     Head: Normocephalic and atraumatic.     Right Ear: Tympanic membrane normal.     Left Ear: Tympanic membrane normal.     Mouth/Throat:     Mouth: Oropharynx is clear and moist.  Eyes:     Pupils: Pupils are equal, round, and reactive to light.  Neck:     Thyroid: No thyromegaly.  Cardiovascular:     Rate and Rhythm: Normal rate and regular rhythm.     Pulses: Intact distal pulses.     Heart sounds: Normal heart sounds. No murmur heard.   Pulmonary:     Effort: Pulmonary effort is normal. No respiratory distress.     Breath sounds: Normal breath sounds. No wheezing.  Abdominal:     General: Bowel sounds are normal. There is no distension.     Palpations: Abdomen is soft.     Tenderness: There is no abdominal tenderness.  Musculoskeletal:        General: No tenderness or edema. Normal range of motion.     Cervical back: Normal range of motion and neck supple.  Skin:    General: Skin is warm and dry.  Neurological:     Mental Status: She is alert and oriented to person, place, and time.     Cranial Nerves: No cranial nerve deficit.     Deep Tendon Reflexes: Reflexes are normal and symmetric.  Psychiatric:        Mood and Affect: Mood and affect normal.        Behavior: Behavior normal.        Thought Content:  Thought content normal.        Judgment: Judgment normal.          BP 128/79   Pulse 72   Temp (!) 96.7 F (35.9 C) (Temporal)   Ht '5\' 8"'  (1.727 m)   Wt 140 lb 6.4 oz (63.7 kg)   BMI 21.35 kg/m   Assessment & Plan:  Theresa Pham comes in today with chief complaint of Medical Management of Chronic Issues and Anxiety   Diagnosis and orders addressed:  1. Benzodiazepine dependence (HCC) - diazepam (VALIUM) 5 MG tablet; Take 1 tablet (5 mg total) by mouth every 12 (twelve) hours as needed for anxiety. TAKE 1 TABLET EVERY 8 HOURS AS NEEDED FOR ANXIETY  Dispense: 60 tablet; Refill: 5 - CMP14+EGFR - CBC  with Differential/Platelet - ToxASSURE Select 13 (MW), Urine  2. Controlled substance agreement signed - diazepam (VALIUM) 5 MG tablet; Take 1 tablet (5 mg total) by mouth every 12 (twelve) hours as needed for anxiety. TAKE 1 TABLET EVERY 8 HOURS AS NEEDED FOR ANXIETY  Dispense: 60 tablet; Refill: 5 - CMP14+EGFR - CBC with Differential/Platelet - ToxASSURE Select 13 (MW), Urine  3. Generalized anxiety disorder - diazepam (VALIUM) 5 MG tablet; Take 1 tablet (5 mg total) by mouth every 12 (twelve) hours as needed for anxiety. TAKE 1 TABLET EVERY 8 HOURS AS NEEDED FOR ANXIETY  Dispense: 60 tablet; Refill: 5 - venlafaxine XR (EFFEXOR-XR) 150 MG 24 hr capsule; TAKE 1 CAPSULE EVERY MORNING WITH BREAKFAST  Dispense: 90 capsule; Refill: 1 - CMP14+EGFR - CBC with Differential/Platelet - busPIRone (BUSPAR) 5 MG tablet; Take 1 tablet (5 mg total) by mouth 3 (three) times daily as needed.  Dispense: 90 tablet; Refill: 2 - ToxASSURE Select 13 (MW), Urine  4. Migraine without aura and without status migrainosus, not intractable - SUMAtriptan (IMITREX) 100 MG tablet; May repeat in 2 hours if headache persists or recurs.  Dispense: 18 tablet; Refill: 0 - CMP14+EGFR - CBC with Differential/Platelet  5. Mixed hyperlipidemia - CMP14+EGFR - CBC with Differential/Platelet - Lipid panel  6. Vitamin D deficiency - CMP14+EGFR - CBC with Differential/Platelet - VITAMIN D 25 Hydroxy (Vit-D Deficiency, Fractures)   Labs pending Health Maintenance reviewed Diet and exercise encouraged  Follow up plan: 6 months    Evelina Dun, FNP

## 2020-09-26 LAB — CMP14+EGFR
ALT: 25 IU/L (ref 0–32)
AST: 20 IU/L (ref 0–40)
Albumin/Globulin Ratio: 1.7 (ref 1.2–2.2)
Albumin: 4.4 g/dL (ref 3.8–4.9)
Alkaline Phosphatase: 117 IU/L (ref 44–121)
BUN/Creatinine Ratio: 23 (ref 12–28)
BUN: 18 mg/dL (ref 8–27)
Bilirubin Total: <0.2 mg/dL (ref 0.0–1.2)
CO2: 21 mmol/L (ref 20–29)
Calcium: 9.6 mg/dL (ref 8.7–10.3)
Chloride: 106 mmol/L (ref 96–106)
Creatinine, Ser: 0.77 mg/dL (ref 0.57–1.00)
GFR calc Af Amer: 97 mL/min/{1.73_m2} (ref >59)
GFR calc non Af Amer: 84 mL/min/{1.73_m2} (ref >59)
Globulin, Total: 2.6 g/dL (ref 1.5–4.5)
Glucose: 87 mg/dL (ref 65–99)
Potassium: 4.5 mmol/L (ref 3.5–5.2)
Sodium: 143 mmol/L (ref 134–144)
Total Protein: 7 g/dL (ref 6.0–8.5)

## 2020-09-26 LAB — VITAMIN D 25 HYDROXY (VIT D DEFICIENCY, FRACTURES): Vit D, 25-Hydroxy: 25.8 ng/mL — ABNORMAL LOW (ref 30.0–100.0)

## 2020-09-26 LAB — LIPID PANEL
Chol/HDL Ratio: 7.5 ratio — ABNORMAL HIGH (ref 0.0–4.4)
Cholesterol, Total: 264 mg/dL — ABNORMAL HIGH (ref 100–199)
HDL: 35 mg/dL — ABNORMAL LOW (ref >39)
LDL Chol Calc (NIH): 195 mg/dL — ABNORMAL HIGH (ref 0–99)
Triglycerides: 181 mg/dL — ABNORMAL HIGH (ref 0–149)
VLDL Cholesterol Cal: 34 mg/dL (ref 5–40)

## 2020-09-26 LAB — CBC WITH DIFFERENTIAL/PLATELET
Basophils Absolute: 0.1 10*3/uL (ref 0.0–0.2)
Basos: 1 %
EOS (ABSOLUTE): 0.5 10*3/uL — ABNORMAL HIGH (ref 0.0–0.4)
Eos: 5 %
Hematocrit: 40.1 % (ref 34.0–46.6)
Hemoglobin: 13.3 g/dL (ref 11.1–15.9)
Immature Grans (Abs): 0 10*3/uL (ref 0.0–0.1)
Immature Granulocytes: 0 %
Lymphocytes Absolute: 3.8 10*3/uL — ABNORMAL HIGH (ref 0.7–3.1)
Lymphs: 37 %
MCH: 31.8 pg (ref 26.6–33.0)
MCHC: 33.2 g/dL (ref 31.5–35.7)
MCV: 96 fL (ref 79–97)
Monocytes Absolute: 0.5 10*3/uL (ref 0.1–0.9)
Monocytes: 5 %
Neutrophils Absolute: 5.2 10*3/uL (ref 1.4–7.0)
Neutrophils: 52 %
Platelets: 370 10*3/uL (ref 150–450)
RBC: 4.18 x10E6/uL (ref 3.77–5.28)
RDW: 13.9 % (ref 11.7–15.4)
WBC: 10.1 10*3/uL (ref 3.4–10.8)

## 2020-09-30 LAB — TOXASSURE SELECT 13 (MW), URINE

## 2020-09-30 NOTE — Progress Notes (Signed)
PATIENT: Theresa Pham DOB: April 07, 1961  REASON FOR VISIT: follow up HISTORY FROM: patient  HISTORY OF PRESENT ILLNESS: Today 10/01/20 Theresa Pham is a 60 year old female with history of migraine headache. She has not benefited from Topamax or Effexor.  She was placed on propanolol at last visit. She had COVID in Dec, shingles during same time. Remains on Propranolol 40 mg twice daily, doing really well. No significant migraine since then. She likes the medications. 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 Cut back on caffeine, cut back on BC, averages 1 a day, for muscle aches and pain, back pain. Has tried diclofenac, helps with sinuses, helps with minor issues. Has taken Imitrex during COVID, but otherwise hasn't needed. Here today alone. She is pleased with how she is doing.   HISTORY  06/04/2020 Dr. Jannifer Franklin: Theresa Pham is a 60 year old right-handed white female with a longstanding history of migraine headaches.  The patient was last seen through this office in 2016.  In the past, she has not tolerated the Topamax, she has been on Effexor but this has not been fully effective in controlling her headaches.  The patient has been taking in large amounts of caffeinated products over the years, initially she was drinking 2 L of [facility] a day, she is cut back to 50 ounces a day, and she takes [facility] powders frequently.  The patient currently is having 3-4 headaches a week, she may take 2 or 3 BC powders on the days that she has the headache.  Her headaches may include the entire head, or there may be unilateral in nature.  They may occasionally associated with nausea and vomiting and are associated with photophobia and phonophobia.  Odors of any sort may be bothersome to her during the headache.  She may have occasional flashes of light in the vision.  She reports no focal numbness or weakness of the face, arms, legs.  She has difficulty concentrating with a headache.  She may miss 3  to 4 days of work a month because of the migraine, she does have FMLA at work.  Occasionally she may have some numbness of the left arm when she wakes up in the morning, she has some chronic neck pain as well.  She has low back pain issues as well.  She takes Imitrex if needed, this is effective in treating her headache.  The patient comes here for further evaluation. She always apparently has headache on the weekends.  REVIEW OF SYSTEMS: Out of a complete 14 system review of symptoms, the patient complains only of the following symptoms, and all other reviewed systems are negative.  Headache   ALLERGIES: Allergies  Allergen Reactions  . Ampicillin Rash  . Azithromycin Diarrhea  . Penicillins Rash  . Erythromycin   . Other     CORNSTARCH -MIGRAINES  . Codeine Nausea Only    HOME MEDICATIONS: Outpatient Medications Prior to Visit  Medication Sig Dispense Refill  . Aspirin-Acetaminophen-Caffeine (GOODY HEADACHE PO) Take 1 packet by mouth every 6 (six) hours as needed (HEADACHES).     . busPIRone (BUSPAR) 5 MG tablet Take 1 tablet (5 mg total) by mouth 3 (three) times daily as needed. 90 tablet 2  . Cholecalciferol (VITAMIN D) 50 MCG (2000 UT) CAPS     . diazepam (VALIUM) 5 MG tablet Take 1 tablet (5 mg total) by mouth every 12 (twelve) hours as needed for anxiety. TAKE 1 TABLET EVERY 8 HOURS AS NEEDED FOR  ANXIETY 60 tablet 5  . diclofenac (CATAFLAM) 50 MG tablet Take 1 tablet (50 mg total) by mouth 3 (three) times daily as needed. 30 tablet 3  . diphenhydrAMINE (BENADRYL) 25 MG tablet Take 25 mg by mouth every 6 (six) hours as needed for allergies or sleep.    . magnesium 30 MG tablet Take 30 mg by mouth 2 (two) times daily.    . propranolol (INDERAL) 20 MG tablet One tablet twice a day for 2 weeks, then take 2 tablets twice a day 120 tablet 3  . SUMAtriptan (IMITREX) 100 MG tablet May repeat in 2 hours if headache persists or recurs. 18 tablet 0  . venlafaxine XR (EFFEXOR-XR) 150 MG 24  hr capsule TAKE 1 CAPSULE EVERY MORNING WITH BREAKFAST 90 capsule 1  . vitamin B-12 (CYANOCOBALAMIN) 1000 MCG tablet Take 1,000 mcg by mouth daily.     No facility-administered medications prior to visit.    PAST MEDICAL HISTORY: Past Medical History:  Diagnosis Date  . Anemia    hx of  . Anxiety   . Arthritis   . Headache(784.0)    migraines, frequent  . History of kidney stones 1993  . Hx of migraine headaches   . Hyperlipidemia   . IBS (irritable bowel syndrome)    hx of, no problems  . TMJ (dislocation of temporomandibular joint)    hx of    PAST SURGICAL HISTORY: Past Surgical History:  Procedure Laterality Date  . CHOLECYSTECTOMY N/A 06/22/2013   Procedure: LAPAROSCOPIC CHOLECYSTECTOMY;  Surgeon: Ralene Ok, MD;  Location: [facility];  Service: General;  Laterality: N/A;  . LASIK  2006, 2012  . NASAL SEPTUM SURGERY  1984  . VAGINAL HYSTERECTOMY  2002   has her right ovary    FAMILY HISTORY: Family History  Problem Relation Age of Onset  . Hypertension Mother   . Uterine cancer Mother   . Cancer Mother        uterine  . Migraines Mother   . Heart attack Father 63  . Migraines Daughter   . Breast cancer Maternal Aunt   . Cancer Maternal Aunt        breast  . Colon cancer Neg Hx     SOCIAL HISTORY: Social History   Socioeconomic History  . Marital status: Married    Spouse name: Mikki Santee  . Number of children: 1  . Years of education: college3  . Highest education level: Not on file  Occupational History  . Occupation: town Copy.  . Occupation: TOWN CLERK     Employer: [employer]    Comment: full time  Tobacco Use  . Smoking status: Former Smoker    Years: 7.00    Types: Cigarettes    Quit date: 08/09/1978    Years since quitting: 42.1  . Smokeless tobacco: Never Used  Vaping Use  . Vaping Use: Never used  Substance and Sexual Activity  . Alcohol use: No    Alcohol/week: 0.0 standard drinks    Comment: rare  . Drug  use: No  . Sexual activity: Yes  Other Topics Concern  . Not on file  Social History Narrative   Patient lives at home husband Mikki Santee.   Patient is right handed.    Patient drinks about 24 ounces of mountain dew daily.   Social Determinants of Health   Financial Resource Strain: Not on file  Food Insecurity: Not on file  Transportation Needs: Not on file  Physical Activity: Not on  file  Stress: Not on file  Social Connections: Not on file  Intimate Partner Violence: Not on file   PHYSICAL EXAM  Vitals:   10/01/20 0946  BP: 112/72  Pulse: 65  Weight: 140 lb (63.5 kg)  Height: 5\' 7"  (1.702 m)   Body mass index is 21.93 kg/m.  Generalized: Well developed, in no acute distress   Neurological examination  Mentation: Alert oriented to time, place, history taking. Follows all commands speech and language fluent Cranial nerve II-XII: Pupils were equal round reactive to light. Extraocular movements were full, visual field were full on confrontational test. Facial sensation and strength were normal. Head turning and shoulder shrug  were normal and symmetric. Motor: The motor testing reveals 5 over 5 strength of all 4 extremities. Good symmetric motor tone is noted throughout.  Sensory: Sensory testing is intact to soft touch on all 4 extremities. No evidence of extinction is noted.  Coordination: Cerebellar testing reveals good finger-nose-finger and heel-to-shin bilaterally.  Gait and station: Gait is normal.  Reflexes: Deep tendon reflexes are symmetric and normal bilaterally.   DIAGNOSTIC DATA (LABS, IMAGING, TESTING) - I reviewed patient records, labs, notes, testing and imaging myself where available.  Lab Results  Component Value Date   WBC 10.1 09/25/2020   HGB 13.3 09/25/2020   HCT 40.1 09/25/2020   MCV 96 09/25/2020   PLT 370 09/25/2020      Component Value Date/Time   NA 143 09/25/2020 0849   K 4.5 09/25/2020 0849   CL 106 09/25/2020 0849   CO2 21 09/25/2020  0849   GLUCOSE 87 09/25/2020 0849   GLUCOSE 60 (L) 11/16/2012 1019   BUN 18 09/25/2020 0849   CREATININE 0.77 09/25/2020 0849   CREATININE 0.84 11/16/2012 1019   CALCIUM 9.6 09/25/2020 0849   PROT 7.0 09/25/2020 0849   ALBUMIN 4.4 09/25/2020 0849   AST 20 09/25/2020 0849   ALT 25 09/25/2020 0849   ALKPHOS 117 09/25/2020 0849   BILITOT <0.2 09/25/2020 0849   GFRNONAA 84 09/25/2020 0849   GFRNONAA 80 11/16/2012 1019   GFRAA 97 09/25/2020 0849   GFRAA >89 11/16/2012 1019   Lab Results  Component Value Date   CHOL 264 (H) 09/25/2020   HDL 35 (L) 09/25/2020   LDLCALC 195 (H) 09/25/2020   TRIG 181 (H) 09/25/2020   CHOLHDL 7.5 (H) 09/25/2020   No results found for: HGBA1C Lab Results  Component Value Date   VITAMINB12 526 12/01/2015   Lab Results  Component Value Date   TSH 1.740 12/01/2015   ASSESSMENT AND PLAN 60 y.o. year old female  has a past medical history of Anemia, Anxiety, Arthritis, Headache(784.0), History of kidney stones (1993), migraine headaches, Hyperlipidemia, IBS (irritable bowel syndrome), and TMJ (dislocation of temporomandibular joint). here with:  1.  Chronic migraine headache  -Excellent benefit with propanolol, no significant migraine since October 2021 -Continue propanolol 40 mg twice a day -Can continue diclofenac potassium if needed, also Imitrex for acute headache -Continue follow-up with PCP, follow-up in our office on an as-needed basis, her choice  I spent 20 minutes of face-to-face and non-face-to-face time with patient.  This included previsit chart review, lab review, study review, order entry, electronic health record documentation, patient education.  Butler Denmark, AGNP-C, DNP 10/01/2020, 9:59 AM [facility] [address] [place], [place] [address] [phone]

## 2020-10-01 ENCOUNTER — Encounter: Payer: Self-pay | Admitting: Neurology

## 2020-10-01 ENCOUNTER — Ambulatory Visit: Payer: PRIVATE HEALTH INSURANCE | Admitting: Neurology

## 2020-10-01 VITALS — BP 112/72 | HR 65 | Ht 67.0 in | Wt 140.0 lb

## 2020-10-01 DIAGNOSIS — G43009 Migraine without aura, not intractable, without status migrainosus: Secondary | ICD-10-CM

## 2020-10-01 MED ORDER — PROPRANOLOL HCL 40 MG PO TABS: 40.0000 mg | ORAL_TABLET | Freq: Two times a day (BID) | ORAL | 3 refills | Status: DC

## 2020-10-01 NOTE — Progress Notes (Signed)
I have read the note, and I agree with the clinical assessment and plan.  Charles K Willis   

## 2020-10-01 NOTE — Patient Instructions (Signed)
Continue current medications Refills sent in  Have PCP continue to fill your medications See [place] as needed

## 2020-10-02 ENCOUNTER — Ambulatory Visit: Payer: PRIVATE HEALTH INSURANCE | Admitting: Neurology

## 2020-11-06 ENCOUNTER — Other Ambulatory Visit: Payer: Self-pay | Admitting: Family

## 2020-11-06 DIAGNOSIS — Z1231 Encounter for screening mammogram for malignant neoplasm of breast: Secondary | ICD-10-CM

## 2020-11-10 ENCOUNTER — Other Ambulatory Visit: Payer: Self-pay | Admitting: Family

## 2020-11-10 DIAGNOSIS — G43009 Migraine without aura, not intractable, without status migrainosus: Secondary | ICD-10-CM

## 2020-12-03 ENCOUNTER — Ambulatory Visit
Admission: RE | Admit: 2020-12-03 | Discharge: 2020-12-03 | Disposition: A | Discharge status: Home / Self Care | Payer: PRIVATE HEALTH INSURANCE | Source: Ambulatory Visit | Attending: Family | Admitting: Family

## 2020-12-03 ENCOUNTER — Other Ambulatory Visit: Payer: Self-pay

## 2020-12-03 DIAGNOSIS — Z1231 Encounter for screening mammogram for malignant neoplasm of breast: Secondary | ICD-10-CM

## 2020-12-05 ENCOUNTER — Other Ambulatory Visit: Payer: Self-pay | Admitting: Family

## 2020-12-05 DIAGNOSIS — F411 Generalized anxiety disorder: Secondary | ICD-10-CM

## 2020-12-05 NOTE — Telephone Encounter (Signed)
Last office visit 09/25/20 Last refill 09/25/20, #90, 2 refills

## 2020-12-08 ENCOUNTER — Encounter: Payer: Self-pay | Admitting: Family Medicine

## 2020-12-19 ENCOUNTER — Other Ambulatory Visit: Payer: Self-pay | Admitting: Family

## 2020-12-19 DIAGNOSIS — G43009 Migraine without aura, not intractable, without status migrainosus: Secondary | ICD-10-CM

## 2021-02-19 ENCOUNTER — Other Ambulatory Visit: Payer: Self-pay | Admitting: Family

## 2021-02-19 DIAGNOSIS — F411 Generalized anxiety disorder: Secondary | ICD-10-CM

## 2021-02-19 DIAGNOSIS — F1320 Sedative, hypnotic or anxiolytic dependence, uncomplicated: Secondary | ICD-10-CM

## 2021-02-19 DIAGNOSIS — Z79899 Other long term (current) drug therapy: Secondary | ICD-10-CM

## 2021-03-06 ENCOUNTER — Other Ambulatory Visit: Payer: Self-pay | Admitting: Family

## 2021-03-06 DIAGNOSIS — G43009 Migraine without aura, not intractable, without status migrainosus: Secondary | ICD-10-CM

## 2021-03-23 ENCOUNTER — Other Ambulatory Visit: Payer: Self-pay | Admitting: Family

## 2021-03-23 DIAGNOSIS — Z79899 Other long term (current) drug therapy: Secondary | ICD-10-CM

## 2021-03-23 DIAGNOSIS — F1320 Sedative, hypnotic or anxiolytic dependence, uncomplicated: Secondary | ICD-10-CM

## 2021-03-23 DIAGNOSIS — F411 Generalized anxiety disorder: Secondary | ICD-10-CM

## 2021-03-26 ENCOUNTER — Encounter: Payer: Self-pay | Admitting: Family

## 2021-03-26 ENCOUNTER — Other Ambulatory Visit: Payer: Self-pay

## 2021-03-26 ENCOUNTER — Ambulatory Visit: Payer: No Typology Code available for payment source | Admitting: Family

## 2021-03-26 VITALS — BP 119/84 | HR 70 | Temp 97.2°F | Ht 67.0 in | Wt 144.8 lb

## 2021-03-26 DIAGNOSIS — R5383 Other fatigue: Secondary | ICD-10-CM

## 2021-03-26 DIAGNOSIS — L309 Dermatitis, unspecified: Secondary | ICD-10-CM

## 2021-03-26 DIAGNOSIS — Z79899 Other long term (current) drug therapy: Secondary | ICD-10-CM | POA: Diagnosis not present

## 2021-03-26 DIAGNOSIS — E559 Vitamin D deficiency, unspecified: Secondary | ICD-10-CM

## 2021-03-26 DIAGNOSIS — F411 Generalized anxiety disorder: Secondary | ICD-10-CM | POA: Diagnosis not present

## 2021-03-26 DIAGNOSIS — G43009 Migraine without aura, not intractable, without status migrainosus: Secondary | ICD-10-CM | POA: Diagnosis not present

## 2021-03-26 DIAGNOSIS — E782 Mixed hyperlipidemia: Secondary | ICD-10-CM

## 2021-03-26 DIAGNOSIS — F1320 Sedative, hypnotic or anxiolytic dependence, uncomplicated: Secondary | ICD-10-CM

## 2021-03-26 MED ORDER — DIAZEPAM 5 MG PO TABS: 5.0000 mg | ORAL_TABLET | Freq: Two times a day (BID) | ORAL | 5 refills | Status: DC | PRN

## 2021-03-26 MED ORDER — HYDROXYZINE PAMOATE 25 MG PO CAPS: 25.0000 mg | ORAL_CAPSULE | Freq: Three times a day (TID) | ORAL | 0 refills | Status: AC | PRN

## 2021-03-26 NOTE — Progress Notes (Signed)
Subjective:    Patient ID: Theresa Pham, female    DOB: [birth year], 60 y.o.   MRN: [id number]  Chief Complaint  Patient presents with   Medical Management of Chronic Issues   Pt calls the office today for chronic follow up. She is requesting her Valium refilled. She is followed by Neurologists as needed for migraines. She was started on propranolol 40 mg BID that has greatly helped with her headaches.   She is complaining of fatigue that has been on going for the last 8 months.   She is also complaining of pruritis lesion on her scalp that has come and gone since COVID 8 months ago.   Hyperlipidemia This is a chronic problem. The current episode started more than 1 year ago. Current antihyperlipidemic treatment includes diet change. The current treatment provides mild improvement of lipids. Risk factors for coronary artery disease include dyslipidemia.  Anxiety Presents for follow-up visit. Symptoms include depressed mood, excessive worry, irritability, nausea ('sometimes"), nervous/anxious behavior and restlessness. Symptoms occur most days. The severity of symptoms is moderate.    Migraine  This is a chronic problem. The current episode started more than 1 year ago. Episode frequency: twice a week. The pain is located in the Left unilateral region. The pain quality is similar to prior headaches. The pain is moderate. Associated symptoms include nausea ('sometimes"), phonophobia and photophobia. She has tried beta blockers for the symptoms. The treatment provided mild relief.     Review of Systems  Constitutional:  Positive for irritability.  Eyes:  Positive for photophobia.  Gastrointestinal:  Positive for nausea ('sometimes").  Psychiatric/Behavioral:  The patient is nervous/anxious.   All other systems reviewed and are negative.     Objective:   Physical Exam Vitals reviewed.  Constitutional:      General: She is not in acute distress.    Appearance: She is  well-developed.  HENT:     Head: Normocephalic and atraumatic.     Right Ear: Tympanic membrane normal.     Left Ear: Tympanic membrane normal.  Eyes:     Pupils: Pupils are equal, round, and reactive to light.  Neck:     Thyroid: No thyromegaly.  Cardiovascular:     Rate and Rhythm: Normal rate and regular rhythm.     Heart sounds: Normal heart sounds. No murmur heard. Pulmonary:     Effort: Pulmonary effort is normal. No respiratory distress.     Breath sounds: Normal breath sounds. No wheezing.  Abdominal:     General: Bowel sounds are normal. There is no distension.     Palpations: Abdomen is soft.     Tenderness: There is no abdominal tenderness.  Musculoskeletal:        General: No tenderness. Normal range of motion.     Cervical back: Normal range of motion and neck supple.  Skin:    General: Skin is warm and dry.  Neurological:     Mental Status: She is alert and oriented to person, place, and time.     Cranial Nerves: No cranial nerve deficit.     Deep Tendon Reflexes: Reflexes are normal and symmetric.  Psychiatric:        Behavior: Behavior normal.        Thought Content: Thought content normal.        Judgment: Judgment normal.      BP 119/84   Pulse 70   Temp (!) 97.2 F (36.2 C) (Temporal)   Ht '5\' 7"'  (1.702  m)   Wt 144 lb 12.8 oz (65.7 kg)   BMI 22.68 kg/m      Assessment & Plan:  ABIGAL CHOUNG comes in today with chief complaint of Medical Management of Chronic Issues   Diagnosis and orders addressed:  1. Benzodiazepine dependence (HCC) - diazepam (VALIUM) 5 MG tablet; Take 1 tablet (5 mg total) by mouth every 12 (twelve) hours as needed for anxiety. TAKE 1 TABLET EVERY 8 HOURS AS NEEDED FOR ANXIETY  Dispense: 60 tablet; Refill: 5 - CMP14+EGFR  2. Controlled substance agreement signed - diazepam (VALIUM) 5 MG tablet; Take 1 tablet (5 mg total) by mouth every 12 (twelve) hours as needed for anxiety. TAKE 1 TABLET EVERY 8 HOURS AS NEEDED FOR  ANXIETY  Dispense: 60 tablet; Refill: 5 - CMP14+EGFR  3. Generalized anxiety disorder - diazepam (VALIUM) 5 MG tablet; Take 1 tablet (5 mg total) by mouth every 12 (twelve) hours as needed for anxiety. TAKE 1 TABLET EVERY 8 HOURS AS NEEDED FOR ANXIETY  Dispense: 60 tablet; Refill: 5 - CMP14+EGFR  4. Migraine without aura and without status migrainosus, not intractable - CMP14+EGFR  5. Mixed hyperlipidemia - CMP14+EGFR  6. Vitamin D deficiency - CMP14+EGFR  7. Other fatigue - CMP14+EGFR - Vitamin B12 - TSH  8. Dermatitis Vistaril as needed  - hydrOXYzine (VISTARIL) 25 MG capsule; Take 1 capsule (25 mg total) by mouth 3 (three) times daily as needed.  Dispense: 90 capsule; Refill: 0   Labs pending Patient reviewed in [place] controlled database, no flags noted. Contract and drug screen are up to date.  Health Maintenance reviewed Diet and exercise encouraged  Follow up plan: 6 months  Evelina Dun, FNP

## 2021-03-26 NOTE — Patient Instructions (Signed)

## 2021-03-27 LAB — CMP14+EGFR
ALT: 9 IU/L (ref 0–32)
AST: 16 IU/L (ref 0–40)
Albumin/Globulin Ratio: 2 (ref 1.2–2.2)
Albumin: 4.3 g/dL (ref 3.8–4.8)
Alkaline Phosphatase: 115 IU/L (ref 44–121)
BUN/Creatinine Ratio: 18 (ref 12–28)
BUN: 15 mg/dL (ref 8–27)
Bilirubin Total: <0.2 mg/dL (ref 0.0–1.2)
CO2: 23 mmol/L (ref 20–29)
Calcium: 9.4 mg/dL (ref 8.7–10.3)
Chloride: 103 mmol/L (ref 96–106)
Creatinine, Ser: 0.82 mg/dL (ref 0.57–1.00)
Globulin, Total: 2.2 g/dL (ref 1.5–4.5)
Glucose: 90 mg/dL (ref 65–99)
Potassium: 4.3 mmol/L (ref 3.5–5.2)
Sodium: 140 mmol/L (ref 134–144)
Total Protein: 6.5 g/dL (ref 6.0–8.5)
eGFR: 81 mL/min/{1.73_m2} (ref >59)

## 2021-03-27 LAB — VITAMIN B12: Vitamin B-12: 270 pg/mL (ref 232–1245)

## 2021-03-27 LAB — TSH: TSH: 2.69 u[IU]/mL (ref 0.450–4.500)

## 2021-04-02 ENCOUNTER — Other Ambulatory Visit: Payer: Self-pay | Admitting: Family

## 2021-04-02 DIAGNOSIS — G43009 Migraine without aura, not intractable, without status migrainosus: Secondary | ICD-10-CM

## 2021-06-18 ENCOUNTER — Other Ambulatory Visit: Payer: Self-pay | Admitting: Family

## 2021-06-18 DIAGNOSIS — F411 Generalized anxiety disorder: Secondary | ICD-10-CM

## 2021-06-26 ENCOUNTER — Other Ambulatory Visit: Payer: Self-pay | Admitting: Family

## 2021-06-26 DIAGNOSIS — F411 Generalized anxiety disorder: Secondary | ICD-10-CM

## 2021-07-24 ENCOUNTER — Other Ambulatory Visit: Payer: Self-pay | Admitting: Family

## 2021-07-24 DIAGNOSIS — G43009 Migraine without aura, not intractable, without status migrainosus: Secondary | ICD-10-CM

## 2021-07-29 ENCOUNTER — Encounter: Payer: Self-pay | Admitting: *Deleted

## 2021-07-30 ENCOUNTER — Encounter: Payer: Self-pay | Admitting: Family

## 2021-07-30 ENCOUNTER — Ambulatory Visit: Payer: No Typology Code available for payment source | Admitting: Family

## 2021-07-30 VITALS — Temp 98.7°F | Ht 67.0 in | Wt 150.8 lb

## 2021-07-30 DIAGNOSIS — J209 Acute bronchitis, unspecified: Secondary | ICD-10-CM | POA: Diagnosis not present

## 2021-07-30 DIAGNOSIS — R051 Acute cough: Secondary | ICD-10-CM

## 2021-07-30 LAB — VERITOR FLU A/B WAIVED
Influenza A: NEGATIVE
Influenza B: NEGATIVE

## 2021-07-30 MED ORDER — BENZONATATE 200 MG PO CAPS: 200.0000 mg | ORAL_CAPSULE | Freq: Three times a day (TID) | ORAL | 1 refills | Status: DC | PRN

## 2021-07-30 MED ORDER — PREDNISONE 10 MG (21) PO TBPK: ORAL_TABLET | ORAL | 0 refills | Status: DC

## 2021-07-30 NOTE — Progress Notes (Signed)
Subjective:    Patient ID: Theresa Pham, female    DOB: 1960/11/06, 60 y.o.   MRN: [id number]  Chief Complaint  Patient presents with   Nasal Congestion    Home COVID was -   Cough    Cough This is a new problem. The current episode started in the past 7 days. The problem has been gradually worsening. The problem occurs every few minutes. The cough is Non-productive. Associated symptoms include headaches, myalgias, nasal congestion, postnasal drip, rhinorrhea, shortness of breath and wheezing. Pertinent negatives include no chills, ear congestion, ear pain or fever. She has tried rest and OTC inhaler for the symptoms. The treatment provided mild relief.     Review of Systems  Constitutional:  Negative for chills and fever.  HENT:  Positive for postnasal drip and rhinorrhea. Negative for ear pain.   Respiratory:  Positive for cough, shortness of breath and wheezing.   Musculoskeletal:  Positive for myalgias.  Neurological:  Positive for headaches.  All other systems reviewed and are negative.     Objective:   Physical Exam Vitals reviewed.  Constitutional:      General: She is not in acute distress.    Appearance: She is well-developed.  HENT:     Head: Normocephalic and atraumatic.     Right Ear: Tympanic membrane normal.     Left Ear: Tympanic membrane normal.  Eyes:     Pupils: Pupils are equal, round, and reactive to light.  Neck:     Thyroid: No thyromegaly.  Cardiovascular:     Rate and Rhythm: Normal rate and regular rhythm.     Heart sounds: Normal heart sounds. No murmur heard. Pulmonary:     Effort: Pulmonary effort is normal. No respiratory distress.     Breath sounds: Wheezing and rhonchi present.  Abdominal:     General: Bowel sounds are normal. There is no distension.     Palpations: Abdomen is soft.     Tenderness: There is no abdominal tenderness.  Musculoskeletal:        General: No tenderness. Normal range of motion.     Cervical back: Normal  range of motion and neck supple.  Skin:    General: Skin is warm and dry.  Neurological:     Mental Status: She is alert and oriented to person, place, and time.     Cranial Nerves: No cranial nerve deficit.     Deep Tendon Reflexes: Reflexes are normal and symmetric.  Psychiatric:        Behavior: Behavior normal.        Thought Content: Thought content normal.        Judgment: Judgment normal.    Temp 98.7 F (37.1 C) (Temporal)    Ht 5\' 7"  (1.702 m)    Wt 150 lb 12.8 oz (68.4 kg)    BMI 23.62 kg/m        Assessment & Plan:  Theresa Pham comes in today with chief complaint of Nasal Congestion (Home COVID was -) and Cough   Diagnosis and orders addressed:  1. Acute cough - Veritor Flu A/B Waived  2. Acute bronchitis, unspecified organism - Take meds as prescribed - Use a cool mist humidifier  -Use saline nose sprays frequently -Force fluids -For any cough or congestion  Use plain Mucinex- regular strength or max strength is fine -For fever or aces or pains- take tylenol or ibuprofen. -Throat lozenges if help -Follow up if symptoms worsen or do not  improve  - predniSONE (STERAPRED UNI-PAK 21 TAB) 10 MG (21) TBPK tablet; Use as directed  Dispense: 21 tablet; Refill: 0 - benzonatate (TESSALON) 200 MG capsule; Take 1 capsule (200 mg total) by mouth 3 (three) times daily as needed.  Dispense: 30 capsule; Refill: [place], FNP

## 2021-07-30 NOTE — Patient Instructions (Signed)
Acute Bronchitis, Adult °Acute bronchitis is sudden inflammation of the main airways (bronchi) that come off the windpipe (trachea) in the lungs. The swelling causes the airways to get smaller and make more mucus than normal. This can make it hard to breathe and can cause coughing or noisy breathing (wheezing). °Acute bronchitis may last several weeks. The cough may last longer. Allergies, asthma, and exposure to smoke may make the condition worse. °What are the causes? °This condition can be caused by germs and by substances that irritate the lungs, including: °Cold and flu viruses. The most common cause of this condition is the virus that causes the common cold. °Bacteria. This is less common. °Breathing in substances that irritate the lungs, including: °Smoke from cigarettes and other forms of tobacco. °Dust and pollen. °Fumes from household cleaning products, gases, or burned fuel. °Indoor or outdoor air pollution. °What increases the risk? °The following factors may make you more likely to develop this condition: °A weak body's defense system, also called the immune system. °A condition that affects your lungs and breathing, such as asthma. °What are the signs or symptoms? °Common symptoms of this condition include: °Coughing. This may bring up clear, yellow, or green mucus from your lungs (sputum). °Wheezing. °Runny or stuffy nose. °Having too much mucus in your lungs (chest congestion). °Shortness of breath. °Aches and pains, including sore throat or chest. °How is this diagnosed? °This condition is usually diagnosed based on: °Your symptoms and medical history. °A physical exam. °You may also have other tests, including tests to rule out other conditions, such as pneumonia. These tests include: °A test of lung function. °Test of a mucus sample to look for the presence of bacteria. °Tests to check the oxygen level in your blood. °Blood tests. °Chest X-ray. °How is this treated? °Most cases of acute bronchitis  clear up over time without treatment. Your health care provider may recommend: °Drinking more fluids to help thin your mucus so it is easier to cough up. °Taking inhaled medicine (inhaler) to improve air flow in and out of your lungs. °Using a vaporizer or a humidifier. These are machines that add water to the air to help you breathe better. °Taking a medicine that thins mucus and clears congestion (expectorant). °Taking a medicine that prevents or stops coughing (cough suppressant). °It is notcommon to take an antibiotic medicine for this condition. °Follow these instructions at home: ° °Take over-the-counter and prescription medicines only as told by your health care provider. °Use an inhaler, vaporizer, or humidifier as told by your health care provider. °Take two teaspoons (10 mL) of honey at bedtime to lessen coughing at night. °Drink enough fluid to keep your urine pale yellow. °Do not use any products that contain nicotine or tobacco. These products include cigarettes, chewing tobacco, and vaping devices, such as e-cigarettes. If you need help quitting, ask your health care provider. °Get plenty of rest. °Return to your normal activities as told by your health care provider. Ask your health care provider what activities are safe for you. °Keep all follow-up visits. This is important. °How is this prevented? °To lower your risk of getting this condition again: °Wash your hands often with soap and water for at least 20 seconds. If soap and water are not available, use hand sanitizer. °Avoid contact with people who have cold symptoms. °Try not to touch your mouth, nose, or eyes with your hands. °Avoid breathing in smoke or chemical fumes. Breathing smoke or chemical fumes will make your condition   worse. °Get the flu shot every year. °Contact a health care provider if: °Your symptoms do not improve after 2 weeks. °You have trouble coughing up the mucus. °Your cough keeps you awake at night. °You have a  fever. °Get help right away if you: °Cough up blood. °Feel pain in your chest. °Have severe shortness of breath. °Faint or keep feeling like you are going to faint. °Have a severe headache. °Have a fever or chills that get worse. °These symptoms may represent a serious problem that is an emergency. Do not wait to see if the symptoms will go away. Get medical help right away. Call your local emergency services (911 in the [place] Do not drive yourself to the hospital. °Summary °Acute bronchitis is inflammation of the main airways (bronchi) that come off the windpipe (trachea) in the lungs. The swelling causes the airways to get smaller and make more mucus than normal. °Drinking more fluids can help thin your mucus so it is easier to cough up. °Take over-the-counter and prescription medicines only as told by your health care provider. °Do not use any products that contain nicotine or tobacco. These products include cigarettes, chewing tobacco, and vaping devices, such as e-cigarettes. If you need help quitting, ask your health care provider. °Contact a health care provider if your symptoms do not improve after 2 weeks. °This information is not intended to replace advice given to you by your health care provider. Make sure you discuss any questions you have with your health care provider. °Document Revised: 11/26/2020 Document Reviewed: 11/26/2020 °Elsevier Patient Education © 2022 [employer] ° °

## 2021-08-25 ENCOUNTER — Other Ambulatory Visit: Payer: Self-pay | Admitting: Family

## 2021-08-25 DIAGNOSIS — F411 Generalized anxiety disorder: Secondary | ICD-10-CM

## 2021-08-25 DIAGNOSIS — Z79899 Other long term (current) drug therapy: Secondary | ICD-10-CM

## 2021-08-25 DIAGNOSIS — J209 Acute bronchitis, unspecified: Secondary | ICD-10-CM

## 2021-08-25 DIAGNOSIS — F1320 Sedative, hypnotic or anxiolytic dependence, uncomplicated: Secondary | ICD-10-CM

## 2021-09-11 ENCOUNTER — Other Ambulatory Visit: Payer: Self-pay | Admitting: Family

## 2021-09-11 DIAGNOSIS — F411 Generalized anxiety disorder: Secondary | ICD-10-CM

## 2021-09-24 ENCOUNTER — Ambulatory Visit (INDEPENDENT_AMBULATORY_CARE_PROVIDER_SITE_OTHER): Payer: No Typology Code available for payment source | Admitting: Family

## 2021-09-24 ENCOUNTER — Other Ambulatory Visit (HOSPITAL_COMMUNITY)
Admission: RE | Admit: 2021-09-24 | Discharge: 2021-09-24 | Disposition: A | Discharge status: Home / Self Care | Payer: No Typology Code available for payment source | Source: Ambulatory Visit | Attending: Family | Admitting: Family

## 2021-09-24 ENCOUNTER — Encounter: Payer: Self-pay | Admitting: Family

## 2021-09-24 VITALS — BP 116/83 | HR 76 | Temp 97.5°F | Ht 67.0 in | Wt 152.4 lb

## 2021-09-24 DIAGNOSIS — F411 Generalized anxiety disorder: Secondary | ICD-10-CM

## 2021-09-24 DIAGNOSIS — Z0000 Encounter for general adult medical examination without abnormal findings: Secondary | ICD-10-CM | POA: Insufficient documentation

## 2021-09-24 DIAGNOSIS — Z0001 Encounter for general adult medical examination with abnormal findings: Secondary | ICD-10-CM

## 2021-09-24 DIAGNOSIS — Z79899 Other long term (current) drug therapy: Secondary | ICD-10-CM

## 2021-09-24 DIAGNOSIS — F1320 Sedative, hypnotic or anxiolytic dependence, uncomplicated: Secondary | ICD-10-CM

## 2021-09-24 DIAGNOSIS — Z01419 Encounter for gynecological examination (general) (routine) without abnormal findings: Secondary | ICD-10-CM

## 2021-09-24 DIAGNOSIS — G43009 Migraine without aura, not intractable, without status migrainosus: Secondary | ICD-10-CM

## 2021-09-24 MED ORDER — DIAZEPAM 5 MG PO TABS: 5.0000 mg | ORAL_TABLET | Freq: Two times a day (BID) | ORAL | 5 refills | Status: DC | PRN

## 2021-09-24 MED ORDER — VENLAFAXINE HCL ER 150 MG PO CP24: 150.0000 mg | ORAL_CAPSULE | Freq: Every day | ORAL | 3 refills | Status: DC

## 2021-09-24 MED ORDER — DICLOFENAC POTASSIUM 50 MG PO TABS: 50.0000 mg | ORAL_TABLET | Freq: Three times a day (TID) | ORAL | 3 refills | Status: DC | PRN

## 2021-09-24 MED ORDER — SUMATRIPTAN SUCCINATE 100 MG PO TABS: ORAL_TABLET | ORAL | 2 refills | Status: DC

## 2021-09-24 MED ORDER — PROPRANOLOL HCL 40 MG PO TABS: 40.0000 mg | ORAL_TABLET | Freq: Two times a day (BID) | ORAL | 3 refills | Status: DC

## 2021-09-24 NOTE — Progress Notes (Signed)
Subjective:    Patient ID: Theresa Pham, female    DOB: 08/23/1960, 61 y.o.   MRN: [id number]  Chief Complaint  Patient presents with   Annual Exam    With pap due for UDS and Contract    Pt calls the office today for CPE with pap. She is requesting her Valium refilled. She is followed by Neurologists as needed for migraines. She was started on propranolol 40 mg BID that has greatly helped with her headaches.   She is retiring this month.  Hyperlipidemia This is a chronic problem. The current episode started more than 1 year ago. The problem is uncontrolled. Current antihyperlipidemic treatment includes diet change. The current treatment provides mild improvement of lipids. Risk factors for coronary artery disease include dyslipidemia and a sedentary lifestyle.  Anxiety Presents for follow-up visit. Symptoms include excessive worry, nervous/anxious behavior and restlessness. Patient reports no irritability. Symptoms occur most days. The severity of symptoms is moderate.    Migraine  This is a chronic problem. The current episode started more than 1 year ago. Episode frequency: several times a week. The pain is located in the Left unilateral region. The pain quality is similar to prior headaches. Associated symptoms include phonophobia and photophobia.  Gynecologic Exam The patient's pertinent negatives include no genital itching or missed menses.     Review of Systems  Constitutional:  Negative for irritability.  Eyes:  Positive for photophobia.  Genitourinary:  Negative for missed menses.  Psychiatric/Behavioral:  The patient is nervous/anxious.   All other systems reviewed and are negative.  Family History  Problem Relation Age of Onset   Hypertension Mother    Uterine cancer Mother    Cancer Mother        uterine   Migraines Mother    Heart attack Father 61   Migraines Daughter    Breast cancer Maternal Aunt    Cancer Maternal Aunt        breast   Colon cancer Neg  Hx    Social History   Socioeconomic History   Marital status: Married    Spouse name: Mikki Santee   Number of children: 1   Years of education: college3   Highest education level: Not on file  Occupational History   Occupation: town Copy.   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   Patient is right handed.    Patient drinks about 24 ounces of mountain dew daily.   Social Determinants of Health   Financial Resource Strain: Not on file  Food Insecurity: Not on file  Transportation Needs: Not on file  Physical Activity: Not on file  Stress: Not on file  Social Connections: Not on file       Objective:   Physical Exam Vitals reviewed.  Constitutional:      General: She is not in acute distress.  Appearance: She is well-developed.  HENT:     Head: Normocephalic and atraumatic.     Right Ear: Tympanic membrane normal.     Left Ear: Tympanic membrane normal.  Eyes:     Pupils: Pupils are equal, round, and reactive to light.  Neck:     Thyroid: No thyromegaly.  Cardiovascular:     Rate and Rhythm: Normal rate and regular rhythm.     Heart sounds: Normal heart sounds. No murmur heard. Pulmonary:     Effort: Pulmonary effort is normal. No respiratory distress.     Breath sounds: Normal breath sounds. No wheezing.  Abdominal:     General: Bowel sounds are normal. There is no distension.     Palpations: Abdomen is soft.     Tenderness: There is no abdominal tenderness.  Genitourinary:     Comments: Bimanual exam- no adnexal masses or tenderness, ovaries nonpalpable   Cervix- Not present  Musculoskeletal:        General: No tenderness. Normal range of motion.     Cervical back: Normal range of motion and neck supple.  Skin:    General: Skin is warm and dry.  Neurological:     Mental Status: She is alert and oriented to person, place, and time.     Cranial Nerves: No cranial nerve deficit.     Deep Tendon Reflexes: Reflexes are normal and symmetric.  Psychiatric:        Behavior: Behavior normal.        Thought Content: Thought content normal.        Judgment: Judgment normal.      BP 116/83    Pulse 76    Temp (!) 97.5 F (36.4 C) (Temporal)    Ht 5\' 7"  (1.702 m)    Wt 152 lb 6.4 oz (69.1 kg)    BMI 23.87 kg/m      Assessment & Plan:  LOUCILLE TAKACH comes in today with chief complaint of Annual Exam (With pap due for UDS and Contract )   Diagnosis and orders addressed:  1. Benzodiazepine dependence (HCC) - ToxASSURE Select 13 (MW), Urine - diazepam (VALIUM) 5 MG tablet; Take 1 tablet (5 mg total) by mouth every 12 (twelve) hours as needed for anxiety. TAKE 1 TABLET EVERY 8 HOURS AS NEEDED FOR ANXIETY  Dispense: 60 tablet; Refill: 5  2. Controlled substance agreement signed - ToxASSURE Select 13 (MW), Urine - diazepam (VALIUM) 5 MG tablet; Take 1 tablet (5 mg total) by mouth every 12 (twelve) hours as needed for anxiety. TAKE 1 TABLET EVERY 8 HOURS AS NEEDED FOR ANXIETY  Dispense: 60 tablet; Refill: 5  3. Generalized anxiety disorder - diazepam (VALIUM) 5 MG tablet; Take 1 tablet (5 mg total) by mouth every 12 (twelve) hours as needed for anxiety. TAKE 1 TABLET EVERY 8 HOURS AS NEEDED FOR ANXIETY  Dispense: 60 tablet; Refill: 5 - venlafaxine XR (EFFEXOR-XR) 150 MG 24 hr capsule; Take 1 capsule (150 mg total) by mouth daily with breakfast.  Dispense: 90 capsule; Refill: 3  4. Migraine without aura and without status migrainosus, not intractable -  SUMAtriptan (IMITREX) 100 MG tablet; May repeat in 2 hours if headache persists or recurs.  Dispense: 18 tablet; Refill: 2 - propranolol (INDERAL) 40 MG tablet; Take 1 tablet (40 mg total) by mouth 2 (two) times daily.  Dispense: 180 tablet; Refill: 3  5. Annual physical exam - Cytology - PAP([place])  6. Encounter for gynecological examination without abnormal finding  Labs pending Patient reviewed in [place] controlled database, no flags noted. Contract and drug screen up dated today.  Health Maintenance reviewed Diet and exercise encouraged  Follow up plan: 6 months    Evelina Dun, FNP

## 2021-09-24 NOTE — Patient Instructions (Signed)

## 2021-09-24 NOTE — Addendum Note (Signed)
Addended by: Evelina Dun A on: 09/24/2021 09:49 AM   Modules accepted: Orders

## 2021-09-25 ENCOUNTER — Encounter: Payer: No Typology Code available for payment source | Admitting: Family

## 2021-09-25 LAB — CMP14+EGFR
ALT: 17 IU/L (ref 0–32)
AST: 22 IU/L (ref 0–40)
Albumin/Globulin Ratio: 2 (ref 1.2–2.2)
Albumin: 4.6 g/dL (ref 3.8–4.8)
Alkaline Phosphatase: 104 IU/L (ref 44–121)
BUN/Creatinine Ratio: 13 (ref 12–28)
BUN: 13 mg/dL (ref 8–27)
Bilirubin Total: 0.2 mg/dL (ref 0.0–1.2)
CO2: 23 mmol/L (ref 20–29)
Calcium: 9.9 mg/dL (ref 8.7–10.3)
Chloride: 103 mmol/L (ref 96–106)
Creatinine, Ser: 0.98 mg/dL (ref 0.57–1.00)
Globulin, Total: 2.3 g/dL (ref 1.5–4.5)
Glucose: 82 mg/dL (ref 70–99)
Potassium: 4.6 mmol/L (ref 3.5–5.2)
Sodium: 140 mmol/L (ref 134–144)
Total Protein: 6.9 g/dL (ref 6.0–8.5)
eGFR: 66 mL/min/{1.73_m2} (ref >59)

## 2021-09-25 LAB — TSH: TSH: 1.27 u[IU]/mL (ref 0.450–4.500)

## 2021-09-25 LAB — CBC WITH DIFFERENTIAL/PLATELET
Basophils Absolute: 0.1 10*3/uL (ref 0.0–0.2)
Basos: 1 %
EOS (ABSOLUTE): 0.3 10*3/uL (ref 0.0–0.4)
Eos: 3 %
Hematocrit: 42 % (ref 34.0–46.6)
Hemoglobin: 14.2 g/dL (ref 11.1–15.9)
Immature Grans (Abs): 0 10*3/uL (ref 0.0–0.1)
Immature Granulocytes: 0 %
Lymphocytes Absolute: 3.2 10*3/uL — ABNORMAL HIGH (ref 0.7–3.1)
Lymphs: 36 %
MCH: 32 pg (ref 26.6–33.0)
MCHC: 33.8 g/dL (ref 31.5–35.7)
MCV: 95 fL (ref 79–97)
Monocytes Absolute: 0.4 10*3/uL (ref 0.1–0.9)
Monocytes: 5 %
Neutrophils Absolute: 5 10*3/uL (ref 1.4–7.0)
Neutrophils: 55 %
Platelets: 392 10*3/uL (ref 150–450)
RBC: 4.44 x10E6/uL (ref 3.77–5.28)
RDW: 13.2 % (ref 11.7–15.4)
WBC: 8.9 10*3/uL (ref 3.4–10.8)

## 2021-09-25 LAB — LIPID PANEL
Chol/HDL Ratio: 7.4 ratio — ABNORMAL HIGH (ref 0.0–4.4)
Cholesterol, Total: 273 mg/dL — ABNORMAL HIGH (ref 100–199)
HDL: 37 mg/dL — ABNORMAL LOW (ref >39)
LDL Chol Calc (NIH): 200 mg/dL — ABNORMAL HIGH (ref 0–99)
Triglycerides: 186 mg/dL — ABNORMAL HIGH (ref 0–149)
VLDL Cholesterol Cal: 36 mg/dL (ref 5–40)

## 2021-09-28 LAB — CYTOLOGY - PAP: Diagnosis: NEGATIVE

## 2021-09-30 LAB — TOXASSURE SELECT 13 (MW), URINE

## 2022-02-19 ENCOUNTER — Other Ambulatory Visit: Payer: Self-pay | Admitting: Family

## 2022-02-19 DIAGNOSIS — Z79899 Other long term (current) drug therapy: Secondary | ICD-10-CM

## 2022-02-19 DIAGNOSIS — F1320 Sedative, hypnotic or anxiolytic dependence, uncomplicated: Secondary | ICD-10-CM

## 2022-02-19 DIAGNOSIS — F411 Generalized anxiety disorder: Secondary | ICD-10-CM

## 2022-03-25 ENCOUNTER — Ambulatory Visit: Payer: 59 | Admitting: Family

## 2022-03-25 ENCOUNTER — Encounter: Payer: Self-pay | Admitting: Family

## 2022-03-25 VITALS — BP 122/83 | HR 69 | Temp 97.2°F | Ht 67.0 in | Wt 144.0 lb

## 2022-03-25 DIAGNOSIS — Z79899 Other long term (current) drug therapy: Secondary | ICD-10-CM

## 2022-03-25 DIAGNOSIS — G43009 Migraine without aura, not intractable, without status migrainosus: Secondary | ICD-10-CM

## 2022-03-25 DIAGNOSIS — F1320 Sedative, hypnotic or anxiolytic dependence, uncomplicated: Secondary | ICD-10-CM | POA: Diagnosis not present

## 2022-03-25 DIAGNOSIS — E559 Vitamin D deficiency, unspecified: Secondary | ICD-10-CM

## 2022-03-25 DIAGNOSIS — F411 Generalized anxiety disorder: Secondary | ICD-10-CM | POA: Diagnosis not present

## 2022-03-25 DIAGNOSIS — E782 Mixed hyperlipidemia: Secondary | ICD-10-CM

## 2022-03-25 LAB — CMP14+EGFR
ALT: 10 IU/L (ref 0–32)
AST: 12 IU/L (ref 0–40)
Albumin/Globulin Ratio: 2 (ref 1.2–2.2)
Albumin: 4.4 g/dL (ref 3.9–4.9)
Alkaline Phosphatase: 111 IU/L (ref 44–121)
BUN/Creatinine Ratio: 9 — ABNORMAL LOW (ref 12–28)
BUN: 8 mg/dL (ref 8–27)
Bilirubin Total: <0.2 mg/dL (ref 0.0–1.2)
CO2: 21 mmol/L (ref 20–29)
Calcium: 9.9 mg/dL (ref 8.7–10.3)
Chloride: 104 mmol/L (ref 96–106)
Creatinine, Ser: 0.86 mg/dL (ref 0.57–1.00)
Globulin, Total: 2.2 g/dL (ref 1.5–4.5)
Glucose: 111 mg/dL — ABNORMAL HIGH (ref 70–99)
Potassium: 3.7 mmol/L (ref 3.5–5.2)
Sodium: 142 mmol/L (ref 134–144)
Total Protein: 6.6 g/dL (ref 6.0–8.5)
eGFR: 76 mL/min/{1.73_m2} (ref >59)

## 2022-03-25 MED ORDER — VENLAFAXINE HCL ER 150 MG PO CP24: 150.0000 mg | ORAL_CAPSULE | Freq: Every day | ORAL | 3 refills | Status: DC

## 2022-03-25 MED ORDER — PROPRANOLOL HCL 40 MG PO TABS: 40.0000 mg | ORAL_TABLET | Freq: Two times a day (BID) | ORAL | 3 refills | Status: DC

## 2022-03-25 MED ORDER — SUMATRIPTAN SUCCINATE 100 MG PO TABS: ORAL_TABLET | ORAL | 2 refills | Status: DC

## 2022-03-25 MED ORDER — DIAZEPAM 5 MG PO TABS: 5.0000 mg | ORAL_TABLET | Freq: Two times a day (BID) | ORAL | 5 refills | Status: DC | PRN

## 2022-03-25 NOTE — Progress Notes (Signed)
Subjective:    Patient ID: Theresa Pham, female    DOB: 1960/12/23, 61 y.o.   MRN: [id number]  Chief Complaint  Patient presents with   Medical Management of Chronic Issues    GI issues. Still can not smell or taste good   Pt calls the office today for chronic follow up.  She is requesting her Valium refilled. She is followed by Neurologists as needed for migraines. She was started on propranolol 40 mg BID that has greatly helped with her headaches.    She retired in March 2023. Enjoying this.  Migraine  This is a chronic problem. The current episode started more than 1 year ago. The problem occurs intermittently (1 month). The problem has been waxing and waning. The pain quality is similar to prior headaches. The pain is moderate. Associated symptoms include nausea, phonophobia, photophobia and vomiting. Pertinent negatives include no dizziness. She has tried antidepressants and beta blockers for the symptoms. The treatment provided moderate relief.  Hyperlipidemia This is a chronic problem. The current episode started more than 1 year ago. The problem is uncontrolled. Pertinent negatives include no myalgias. Current antihyperlipidemic treatment includes diet change. The current treatment provides no improvement of lipids. Risk factors for coronary artery disease include dyslipidemia.  Anxiety Presents for follow-up visit. Symptoms include nausea and nervous/anxious behavior. Patient reports no depressed mood, dizziness or irritability. Symptoms occur rarely. The severity of symptoms is mild.        Review of Systems  Constitutional:  Negative for irritability.  Eyes:  Positive for photophobia.  Gastrointestinal:  Positive for nausea and vomiting.  Musculoskeletal:  Negative for myalgias.  Neurological:  Negative for dizziness.  Psychiatric/Behavioral:  The patient is nervous/anxious.   All other systems reviewed and are negative.      Objective:   Physical Exam Vitals  reviewed.  Constitutional:      General: She is not in acute distress.    Appearance: She is well-developed.  HENT:     Head: Normocephalic and atraumatic.     Right Ear: Tympanic membrane normal.     Left Ear: Tympanic membrane normal.  Eyes:     Pupils: Pupils are equal, round, and reactive to light.  Neck:     Thyroid: No thyromegaly.  Cardiovascular:     Rate and Rhythm: Normal rate and regular rhythm.     Heart sounds: Normal heart sounds. No murmur heard. Pulmonary:     Effort: Pulmonary effort is normal. No respiratory distress.     Breath sounds: Normal breath sounds. No wheezing.  Abdominal:     General: Bowel sounds are normal. There is no distension.     Palpations: Abdomen is soft.     Tenderness: There is no abdominal tenderness.  Musculoskeletal:        General: No tenderness. Normal range of motion.     Cervical back: Normal range of motion and neck supple.  Skin:    General: Skin is warm and dry.  Neurological:     Mental Status: She is alert and oriented to person, place, and time.     Cranial Nerves: No cranial nerve deficit.     Deep Tendon Reflexes: Reflexes are normal and symmetric.  Psychiatric:        Behavior: Behavior normal.        Thought Content: Thought content normal.        Judgment: Judgment normal.        BP 122/83   Pulse 69  Temp (!) 97.2 F (36.2 C) (Temporal)   Ht _0  (1.702 m)   Wt 144 lb (65.3 kg)   SpO2 99%   BMI 22.55 kg/m   Assessment & Plan:  Theresa Pham comes in today with chief complaint of Medical Management of Chronic Issues (GI issues. Still can not smell or taste good)   Diagnosis and orders addressed:  1. Benzodiazepine dependence (HCC) - diazepam (VALIUM) 5 MG tablet; Take 1 tablet (5 mg total) by mouth every 12 (twelve) hours as needed for anxiety. TAKE 1 TABLET EVERY 8 HOURS AS NEEDED FOR ANXIETY  Dispense: 60 tablet; Refill: 5 - CMP14+EGFR  2. Controlled substance agreement signed - diazepam  (VALIUM) 5 MG tablet; Take 1 tablet (5 mg total) by mouth every 12 (twelve) hours as needed for anxiety. TAKE 1 TABLET EVERY 8 HOURS AS NEEDED FOR ANXIETY  Dispense: 60 tablet; Refill: 5 - CMP14+EGFR  3. Generalized anxiety disorder - diazepam (VALIUM) 5 MG tablet; Take 1 tablet (5 mg total) by mouth every 12 (twelve) hours as needed for anxiety. TAKE 1 TABLET EVERY 8 HOURS AS NEEDED FOR ANXIETY  Dispense: 60 tablet; Refill: 5 - venlafaxine XR (EFFEXOR-XR) 150 MG 24 hr capsule; Take 1 capsule (150 mg total) by mouth daily with breakfast.  Dispense: 90 capsule; Refill: 3 - CMP14+EGFR  4. Migraine without aura and without status migrainosus, not intractable - SUMAtriptan (IMITREX) 100 MG tablet; May repeat in 2 hours if headache persists or recurs.  Dispense: 18 tablet; Refill: 2 - propranolol (INDERAL) 40 MG tablet; Take 1 tablet (40 mg total) by mouth 2 (two) times daily.  Dispense: 180 tablet; Refill: 3 - CMP14+EGFR  5. Mixed hyperlipidemia - CMP14+EGFR  6. Vitamin D deficiency - CMP14+EGFR   Labs pending Patient reviewed in  controlled database, no flags noted. Contract and drug screen are up to date.  Health Maintenance reviewed Diet and exercise encouraged  Follow up plan: 6 months    Evelina Dun, FNP

## 2022-03-25 NOTE — Patient Instructions (Signed)

## 2022-07-30 ENCOUNTER — Other Ambulatory Visit: Payer: Self-pay | Admitting: Family

## 2022-07-30 DIAGNOSIS — G43009 Migraine without aura, not intractable, without status migrainosus: Secondary | ICD-10-CM

## 2022-09-09 ENCOUNTER — Other Ambulatory Visit: Payer: Self-pay | Admitting: Family

## 2022-09-09 DIAGNOSIS — F411 Generalized anxiety disorder: Secondary | ICD-10-CM

## 2022-09-09 DIAGNOSIS — Z79899 Other long term (current) drug therapy: Secondary | ICD-10-CM

## 2022-09-09 DIAGNOSIS — F1320 Sedative, hypnotic or anxiolytic dependence, uncomplicated: Secondary | ICD-10-CM

## 2022-09-23 ENCOUNTER — Ambulatory Visit: Payer: 59 | Admitting: Family

## 2022-09-23 ENCOUNTER — Encounter: Payer: Self-pay | Admitting: Family

## 2022-09-23 VITALS — BP 102/62 | HR 67 | Temp 97.7°F | Ht 67.0 in | Wt 136.4 lb

## 2022-09-23 DIAGNOSIS — F411 Generalized anxiety disorder: Secondary | ICD-10-CM

## 2022-09-23 DIAGNOSIS — J069 Acute upper respiratory infection, unspecified: Secondary | ICD-10-CM | POA: Diagnosis not present

## 2022-09-23 DIAGNOSIS — R69 Illness, unspecified: Secondary | ICD-10-CM | POA: Diagnosis not present

## 2022-09-23 DIAGNOSIS — E782 Mixed hyperlipidemia: Secondary | ICD-10-CM

## 2022-09-23 DIAGNOSIS — F1320 Sedative, hypnotic or anxiolytic dependence, uncomplicated: Secondary | ICD-10-CM

## 2022-09-23 DIAGNOSIS — Z79899 Other long term (current) drug therapy: Secondary | ICD-10-CM

## 2022-09-23 DIAGNOSIS — Z0001 Encounter for general adult medical examination with abnormal findings: Secondary | ICD-10-CM | POA: Diagnosis not present

## 2022-09-23 DIAGNOSIS — G43009 Migraine without aura, not intractable, without status migrainosus: Secondary | ICD-10-CM

## 2022-09-23 DIAGNOSIS — Z0000 Encounter for general adult medical examination without abnormal findings: Secondary | ICD-10-CM

## 2022-09-23 DIAGNOSIS — E559 Vitamin D deficiency, unspecified: Secondary | ICD-10-CM | POA: Diagnosis not present

## 2022-09-23 MED ORDER — DIAZEPAM 5 MG PO TABS: 5.0000 mg | ORAL_TABLET | Freq: Two times a day (BID) | ORAL | 5 refills | Status: DC | PRN

## 2022-09-23 MED ORDER — FLUTICASONE PROPIONATE 50 MCG/ACT NA SUSP: 2.0000 | Freq: Every day | NASAL | 6 refills | Status: DC

## 2022-09-23 MED ORDER — CETIRIZINE HCL 10 MG PO TABS: 10.0000 mg | ORAL_TABLET | Freq: Every day | ORAL | 1 refills | Status: DC

## 2022-09-23 MED ORDER — PREDNISONE 10 MG (21) PO TBPK: ORAL_TABLET | ORAL | 0 refills | Status: DC

## 2022-09-23 NOTE — Patient Instructions (Signed)

## 2022-09-23 NOTE — Progress Notes (Signed)
Subjective:    Patient ID: Theresa Pham, female    DOB: [birth year], 62 y.o.   MRN: [id number]  Chief Complaint  Patient presents with   Medical Management of Chronic Issues   Sinus Problem    Since staying w/ mom R side staying stopped up, mom's house hot   Cough   Pt presents to the office today for chronic follow up.  She is requesting her Valium refilled. She is followed by Neurologists as needed for migraines. She was started on propranolol 40 mg BID that has greatly helped with her headaches.    She retired in March 2023. Enjoying this. She is caring for her mother.  Sinus Problem Associated symptoms include coughing and ear pain (right). Pertinent negatives include no chills, headaches or shortness of breath.  Cough This is a new problem. The current episode started 1 to 4 weeks ago. The problem has been waxing and waning. The problem occurs every few minutes. The cough is Non-productive. Associated symptoms include ear pain (right) and nasal congestion. Pertinent negatives include no chills, ear congestion, fever, headaches, myalgias, shortness of breath or wheezing. She has tried rest (mucinex) for the symptoms. The treatment provided mild relief.  Migraine  This is a chronic problem. The current episode started more than 1 year ago. The problem occurs intermittently (getting twice a week for the last few months related to stress). The pain is located in the Left unilateral region. Associated symptoms include coughing and ear pain (right). Pertinent negatives include no fever. She has tried beta blockers for the symptoms. The treatment provided mild relief. Her past medical history is significant for migraine headaches.  Hyperlipidemia This is a chronic problem. The current episode started more than 1 year ago. The problem is uncontrolled. Pertinent negatives include no myalgias or shortness of breath. Current antihyperlipidemic treatment includes diet change. The current treatment  provides no improvement of lipids. Risk factors for coronary artery disease include dyslipidemia, hypertension, a sedentary lifestyle and post-menopausal.  Anxiety Presents for follow-up visit. Symptoms include excessive worry, irritability, nervous/anxious behavior and restlessness. Patient reports no shortness of breath. Symptoms occur occasionally. The severity of symptoms is moderate.        Review of Systems  Constitutional:  Positive for irritability. Negative for chills and fever.  HENT:  Positive for ear pain (right).   Respiratory:  Positive for cough. Negative for shortness of breath and wheezing.   Musculoskeletal:  Negative for myalgias.  Neurological:  Negative for headaches.  Psychiatric/Behavioral:  The patient is nervous/anxious.   All other systems reviewed and are negative.  Family History  Problem Relation Age of Onset   Hypertension Mother    Uterine cancer Mother    Cancer Mother        uterine   Migraines Mother    Heart attack Father 24   Migraines Daughter    Breast cancer Maternal Aunt    Cancer Maternal Aunt        breast   Colon cancer Neg Hx    Social History   Socioeconomic History   Marital status: Married    Spouse name: Mikki Santee   Number of children: 1   Years of education: college3   Highest education level: Not on file  Occupational History   Occupation: town Copy.   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   Patient is right handed.     Patient drinks about 24 ounces of mountain dew daily.   Social Determinants of Health   Financial Resource Strain: Not on file  Food Insecurity: Not on file  Transportation Needs: Not on file  Physical Activity: Not on file  Stress: Not on file  Social Connections: Not on file       Objective:   Physical Exam Vitals reviewed.  Constitutional:      General: She is not in acute distress.    Appearance: She is well-developed.  HENT:     Head: Normocephalic and atraumatic.     Right Ear: Tympanic membrane normal.     Left Ear: Tympanic membrane normal.  Eyes:     Pupils: Pupils are equal, round, and reactive to light.  Neck:     Thyroid: No thyromegaly.  Cardiovascular:     Rate and Rhythm: Normal rate and regular rhythm.     Heart sounds: Normal heart sounds. No murmur heard. Pulmonary:     Effort: Pulmonary effort is normal. No respiratory distress.     Breath sounds: Normal breath sounds. No wheezing.  Abdominal:     General: Bowel sounds are normal. There is no distension.     Palpations: Abdomen is soft.     Tenderness: There is no abdominal tenderness.  Musculoskeletal:        General: No tenderness. Normal range of motion.     Cervical back: Normal range of motion and neck supple.  Skin:    General: Skin is warm and dry.  Neurological:     Mental Status: She is alert and oriented to person, place, and time.     Cranial Nerves: No cranial nerve deficit.     Deep Tendon Reflexes: Reflexes are normal and symmetric.  Psychiatric:        Behavior: Behavior normal.        Thought Content: Thought content normal.        Judgment: Judgment normal.      BP 102/62   Pulse 67   Temp 97.7 F (36.5 C) (Temporal)   Ht 5' 7"$  (1.702 m)   Wt 136 lb 6.4 oz (61.9 kg)   SpO2 96%   BMI 21.36 kg/m       Assessment & Plan:  Theresa Pham comes in today with chief complaint of Medical Management of Chronic Issues, Sinus Problem (Since staying w/ mom R side staying  stopped up, mom's house hot), and Cough   Diagnosis and orders addressed:  1. Generalized anxiety disorder - diazepam (VALIUM) 5 MG tablet; Take 1 tablet (5 mg total) by mouth every 12 (twelve) hours as needed for anxiety. TAKE 1 TABLET EVERY 8 HOURS AS NEEDED FOR ANXIETY  Dispense: 60 tablet; Refill: 5 - CMP14+EGFR - CBC with Differential/Platelet  2. Mixed hyperlipidemia - CMP14+EGFR - CBC with Differential/Platelet - Lipid panel  3. Migraine without aura and without status migrainosus, not intractable - CMP14+EGFR - CBC with Differential/Platelet  4. Vitamin D deficiency - CMP14+EGFR - CBC with Differential/Platelet - VITAMIN D 25 Hydroxy (Vit-D Deficiency, Fractures)  5. Controlled substance agreement signed - diazepam (VALIUM) 5 MG tablet; Take 1 tablet (5 mg total) by mouth every 12 (twelve) hours as needed for anxiety. TAKE 1 TABLET EVERY 8 HOURS AS NEEDED FOR ANXIETY  Dispense: 60 tablet; Refill: 5 - CMP14+EGFR - CBC with Differential/Platelet - ToxASSURE Select 13 (MW), Urine  6. Benzodiazepine dependence (HCC) - diazepam (VALIUM) 5 MG tablet; Take 1 tablet (5 mg total) by mouth every 12 (twelve) hours as needed for anxiety. TAKE 1 TABLET EVERY 8 HOURS AS NEEDED FOR ANXIETY  Dispense: 60 tablet; Refill: 5 - CMP14+EGFR - CBC with Differential/Platelet - ToxASSURE Select 13 (MW), Urine  7. Annual physical exam - CMP14+EGFR - CBC with Differential/Platelet - Lipid panel - TSH - ToxASSURE Select 13 (MW), Urine   8. Viral URI Start zyrtec and flonase Start prednisone - Take meds as prescribed - Use a cool mist humidifier  -Use saline nose sprays frequently -Force fluids -For any cough or congestion  Use plain Mucinex- regular strength or max strength is fine -For fever or aces or pains- take tylenol or ibuprofen. -Throat lozenges if help Follow up if symptoms worse or do not improve - predniSONE (STERAPRED UNI-PAK 21 TAB) 10 MG (21) TBPK tablet; Use as  directed  Dispense: 21 tablet; Refill: 0   Labs pending Patient reviewed in [place] controlled database, no flags noted. Contract and drug screen are up to date.  Health Maintenance reviewed Diet and exercise encouraged  Follow up plan: 6 months    Evelina Dun, FNP

## 2022-09-24 ENCOUNTER — Other Ambulatory Visit: Payer: Self-pay | Admitting: Family

## 2022-09-24 LAB — CBC WITH DIFFERENTIAL/PLATELET
Basophils Absolute: 0.1 10*3/uL (ref 0.0–0.2)
Basos: 1 %
EOS (ABSOLUTE): 0.2 10*3/uL (ref 0.0–0.4)
Eos: 2 %
Hematocrit: 37.8 % (ref 34.0–46.6)
Hemoglobin: 12.6 g/dL (ref 11.1–15.9)
Immature Grans (Abs): 0 10*3/uL (ref 0.0–0.1)
Immature Granulocytes: 0 %
Lymphocytes Absolute: 4.4 10*3/uL — ABNORMAL HIGH (ref 0.7–3.1)
Lymphs: 38 %
MCH: 31.9 pg (ref 26.6–33.0)
MCHC: 33.3 g/dL (ref 31.5–35.7)
MCV: 96 fL (ref 79–97)
Monocytes Absolute: 0.8 10*3/uL (ref 0.1–0.9)
Monocytes: 7 %
Neutrophils Absolute: 6 10*3/uL (ref 1.4–7.0)
Neutrophils: 52 %
Platelets: 397 10*3/uL (ref 150–450)
RBC: 3.95 x10E6/uL (ref 3.77–5.28)
RDW: 13.8 % (ref 11.7–15.4)
WBC: 11.5 10*3/uL — ABNORMAL HIGH (ref 3.4–10.8)

## 2022-09-24 LAB — CMP14+EGFR
ALT: 8 IU/L (ref 0–32)
AST: 14 IU/L (ref 0–40)
Albumin/Globulin Ratio: 2 (ref 1.2–2.2)
Albumin: 4.3 g/dL (ref 3.9–4.9)
Alkaline Phosphatase: 112 IU/L (ref 44–121)
BUN/Creatinine Ratio: 14 (ref 12–28)
BUN: 11 mg/dL (ref 8–27)
Bilirubin Total: <0.2 mg/dL (ref 0.0–1.2)
CO2: 22 mmol/L (ref 20–29)
Calcium: 9.4 mg/dL (ref 8.7–10.3)
Chloride: 104 mmol/L (ref 96–106)
Creatinine, Ser: 0.78 mg/dL (ref 0.57–1.00)
Globulin, Total: 2.2 g/dL (ref 1.5–4.5)
Glucose: 77 mg/dL (ref 70–99)
Potassium: 4.6 mmol/L (ref 3.5–5.2)
Sodium: 142 mmol/L (ref 134–144)
Total Protein: 6.5 g/dL (ref 6.0–8.5)
eGFR: 86 mL/min/{1.73_m2} (ref >59)

## 2022-09-24 LAB — TSH: TSH: 1.64 u[IU]/mL (ref 0.450–4.500)

## 2022-09-24 LAB — LIPID PANEL
Chol/HDL Ratio: 6.3 ratio — ABNORMAL HIGH (ref 0.0–4.4)
Cholesterol, Total: 190 mg/dL (ref 100–199)
HDL: 30 mg/dL — ABNORMAL LOW (ref >39)
LDL Chol Calc (NIH): 125 mg/dL — ABNORMAL HIGH (ref 0–99)
Triglycerides: 197 mg/dL — ABNORMAL HIGH (ref 0–149)
VLDL Cholesterol Cal: 35 mg/dL (ref 5–40)

## 2022-09-24 LAB — VITAMIN D 25 HYDROXY (VIT D DEFICIENCY, FRACTURES): Vit D, 25-Hydroxy: 29.9 ng/mL — ABNORMAL LOW (ref 30.0–100.0)

## 2022-09-24 MED ORDER — VITAMIN D (ERGOCALCIFEROL) 1.25 MG (50000 UNIT) PO CAPS: 50000.0000 [IU] | ORAL_CAPSULE | ORAL | 3 refills | Status: AC

## 2022-09-28 LAB — TOXASSURE SELECT 13 (MW), URINE

## 2022-10-21 ENCOUNTER — Other Ambulatory Visit: Payer: Self-pay | Admitting: Family

## 2022-10-21 DIAGNOSIS — G43009 Migraine without aura, not intractable, without status migrainosus: Secondary | ICD-10-CM

## 2023-01-10 ENCOUNTER — Other Ambulatory Visit: Payer: Self-pay | Admitting: Family

## 2023-01-10 DIAGNOSIS — G43009 Migraine without aura, not intractable, without status migrainosus: Secondary | ICD-10-CM

## 2023-02-02 DIAGNOSIS — Z791 Long term (current) use of non-steroidal anti-inflammatories (NSAID): Secondary | ICD-10-CM | POA: Diagnosis not present

## 2023-02-02 DIAGNOSIS — G43909 Migraine, unspecified, not intractable, without status migrainosus: Secondary | ICD-10-CM | POA: Diagnosis not present

## 2023-02-02 DIAGNOSIS — I129 Hypertensive chronic kidney disease with stage 1 through stage 4 chronic kidney disease, or unspecified chronic kidney disease: Secondary | ICD-10-CM | POA: Diagnosis not present

## 2023-02-02 DIAGNOSIS — Z803 Family history of malignant neoplasm of breast: Secondary | ICD-10-CM | POA: Diagnosis not present

## 2023-02-02 DIAGNOSIS — Z880 Allergy status to penicillin: Secondary | ICD-10-CM | POA: Diagnosis not present

## 2023-02-02 DIAGNOSIS — J309 Allergic rhinitis, unspecified: Secondary | ICD-10-CM | POA: Diagnosis not present

## 2023-02-02 DIAGNOSIS — M1990 Unspecified osteoarthritis, unspecified site: Secondary | ICD-10-CM | POA: Diagnosis not present

## 2023-02-02 DIAGNOSIS — F411 Generalized anxiety disorder: Secondary | ICD-10-CM | POA: Diagnosis not present

## 2023-02-02 DIAGNOSIS — Z8249 Family history of ischemic heart disease and other diseases of the circulatory system: Secondary | ICD-10-CM | POA: Diagnosis not present

## 2023-02-02 DIAGNOSIS — E785 Hyperlipidemia, unspecified: Secondary | ICD-10-CM | POA: Diagnosis not present

## 2023-03-21 ENCOUNTER — Other Ambulatory Visit: Payer: Self-pay

## 2023-03-21 DIAGNOSIS — Z1231 Encounter for screening mammogram for malignant neoplasm of breast: Secondary | ICD-10-CM

## 2023-03-24 ENCOUNTER — Other Ambulatory Visit: Payer: Self-pay | Admitting: Family

## 2023-03-24 DIAGNOSIS — F1320 Sedative, hypnotic or anxiolytic dependence, uncomplicated: Secondary | ICD-10-CM

## 2023-03-24 DIAGNOSIS — Z79899 Other long term (current) drug therapy: Secondary | ICD-10-CM

## 2023-03-24 DIAGNOSIS — F411 Generalized anxiety disorder: Secondary | ICD-10-CM

## 2023-03-25 ENCOUNTER — Encounter: Payer: Self-pay | Admitting: Family

## 2023-03-25 ENCOUNTER — Ambulatory Visit: Payer: 59 | Admitting: Family

## 2023-03-25 VITALS — BP 126/74 | HR 67 | Temp 97.3°F | Ht 67.0 in | Wt 147.2 lb

## 2023-03-25 DIAGNOSIS — M545 Low back pain, unspecified: Secondary | ICD-10-CM | POA: Diagnosis not present

## 2023-03-25 DIAGNOSIS — G43009 Migraine without aura, not intractable, without status migrainosus: Secondary | ICD-10-CM | POA: Diagnosis not present

## 2023-03-25 DIAGNOSIS — G8929 Other chronic pain: Secondary | ICD-10-CM

## 2023-03-25 DIAGNOSIS — Z79899 Other long term (current) drug therapy: Secondary | ICD-10-CM | POA: Diagnosis not present

## 2023-03-25 DIAGNOSIS — E782 Mixed hyperlipidemia: Secondary | ICD-10-CM | POA: Diagnosis not present

## 2023-03-25 DIAGNOSIS — F411 Generalized anxiety disorder: Secondary | ICD-10-CM | POA: Diagnosis not present

## 2023-03-25 DIAGNOSIS — Z1211 Encounter for screening for malignant neoplasm of colon: Secondary | ICD-10-CM

## 2023-03-25 DIAGNOSIS — F1320 Sedative, hypnotic or anxiolytic dependence, uncomplicated: Secondary | ICD-10-CM

## 2023-03-25 DIAGNOSIS — E559 Vitamin D deficiency, unspecified: Secondary | ICD-10-CM

## 2023-03-25 MED ORDER — VENLAFAXINE HCL ER 150 MG PO CP24
150.0000 mg | ORAL_CAPSULE | Freq: Every day | ORAL | 3 refills | Status: DC
Start: 2023-03-25 — End: 2024-03-14

## 2023-03-25 MED ORDER — DIAZEPAM 5 MG PO TABS
5.0000 mg | ORAL_TABLET | Freq: Two times a day (BID) | ORAL | 5 refills | Status: DC | PRN
Start: 2023-03-25 — End: 2023-09-26

## 2023-03-25 MED ORDER — DICLOFENAC POTASSIUM 50 MG PO TABS
50.0000 mg | ORAL_TABLET | Freq: Three times a day (TID) | ORAL | 3 refills | Status: AC | PRN
Start: 2023-03-25 — End: ?

## 2023-03-25 NOTE — Patient Instructions (Signed)

## 2023-03-25 NOTE — Progress Notes (Signed)
Subjective:    Patient ID: Theresa Pham, female    DOB: 1961/08/06, 62 y.o.   MRN: [id number]  Chief Complaint  Patient presents with   Medical Management of Chronic Issues   Back Pain   Pt presents to the office today for chronic follow up.  She is requesting her Valium refilled. She is followed by Neurologists as needed for migraines. She was started on propranolol 40 mg BID that has greatly helped with her headaches.    She retired in March 2023. Enjoying this. 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  Back Pain This is a chronic problem. The current episode started more than 1 year ago. The problem occurs intermittently. The problem has been waxing and waning since onset. The pain is present in the lumbar spine. The quality of the pain is described as aching. The pain is at a severity of 8/10. The pain is moderate. She has tried NSAIDs for the symptoms. The treatment provided moderate relief.  Anxiety Presents for follow-up visit. Symptoms include depressed mood, excessive worry, nausea and nervous/anxious behavior. Symptoms occur occasionally. The severity of symptoms is moderate.    Hyperlipidemia This is a chronic problem. The current episode started more than 1 year ago. The problem is controlled. Recent lipid tests were reviewed and are normal. Current antihyperlipidemic treatment includes statins. The current treatment provides moderate improvement of lipids. Risk factors for coronary artery disease include dyslipidemia, hypertension, a sedentary lifestyle and post-menopausal.  Migraine  This is a chronic problem. The current episode started more than 1 year ago. The problem occurs intermittently. The pain is located in the Left unilateral region. The pain quality is similar to prior headaches. Associated symptoms include back pain, nausea, phonophobia and photophobia. Pertinent negatives include no vomiting. She has tried beta blockers for the symptoms. The treatment  provided moderate relief. Her past medical history is significant for migraine headaches.      Review of Systems  Eyes:  Positive for photophobia.  Gastrointestinal:  Positive for nausea. Negative for vomiting.  Musculoskeletal:  Positive for back pain.  Psychiatric/Behavioral:  The patient is nervous/anxious.   All other systems reviewed and are negative.      Objective:   Physical Exam Vitals reviewed.  Constitutional:      General: She is not in acute distress.    Appearance: She is well-developed.  HENT:     Head: Normocephalic and atraumatic.     Right Ear: Tympanic membrane normal.     Left Ear: Tympanic membrane normal.  Eyes:     Pupils: Pupils are equal, round, and reactive to light.  Neck:     Thyroid: No thyromegaly.  Cardiovascular:     Rate and Rhythm: Normal rate and regular rhythm.     Heart sounds: Normal heart sounds. No murmur heard. Pulmonary:     Effort: Pulmonary effort is normal. No respiratory distress.     Breath sounds: Normal breath sounds. No wheezing.  Abdominal:     General: Bowel sounds are normal. There is no distension.     Palpations: Abdomen is soft.     Tenderness: There is no abdominal tenderness.  Musculoskeletal:        General: No tenderness. Normal range of motion.     Cervical back: Normal range of motion and neck supple.  Skin:    General: Skin is warm and dry.  Neurological:     Mental Status: She is alert and oriented to person,  place, and time.     Cranial Nerves: No cranial nerve deficit.     Deep Tendon Reflexes: Reflexes are normal and symmetric.  Psychiatric:        Behavior: Behavior normal.        Thought Content: Thought content normal.        Judgment: Judgment normal.     BP 126/74   Pulse 67   Temp (!) 97.3 F (36.3 C) (Temporal)   Ht 5\' 7"  (1.702 m)   Wt 147 lb 3.2 oz (66.8 kg)   BMI 23.05 kg/m        Assessment & Plan:   MAELY DIONNE comes in today with chief complaint of Medical  Management of Chronic Issues and Back Pain   Diagnosis and orders addressed:  1. Generalized anxiety disorder - diazepam (VALIUM) 5 MG tablet; Take 1 tablet (5 mg total) by mouth every 12 (twelve) hours as needed for anxiety. TAKE 1 TABLET EVERY 8 HOURS AS NEEDED FOR ANXIETY  Dispense: 60 tablet; Refill: 5 - venlafaxine XR (EFFEXOR-XR) 150 MG 24 hr capsule; Take 1 capsule (150 mg total) by mouth daily with breakfast.  Dispense: 90 capsule; Refill: 3 - ToxASSURE Select 13 (MW), Urine - CMP14+EGFR  2. Controlled substance agreement signed - diazepam (VALIUM) 5 MG tablet; Take 1 tablet (5 mg total) by mouth every 12 (twelve) hours as needed for anxiety. TAKE 1 TABLET EVERY 8 HOURS AS NEEDED FOR ANXIETY  Dispense: 60 tablet; Refill: 5 - ToxASSURE Select 13 (MW), Urine - CMP14+EGFR  3. Benzodiazepine dependence (HCC) - diazepam (VALIUM) 5 MG tablet; Take 1 tablet (5 mg total) by mouth every 12 (twelve) hours as needed for anxiety. TAKE 1 TABLET EVERY 8 HOURS AS NEEDED FOR ANXIETY  Dispense: 60 tablet; Refill: 5 - ToxASSURE Select 13 (MW), Urine - CMP14+EGFR  4. Mixed hyperlipidemia - CMP14+EGFR  5. Vitamin D deficiency - CMP14+EGFR  6. Migraine without aura and without status migrainosus, not intractable - CMP14+EGFR  7. Chronic bilateral low back pain without sciatica - diclofenac (CATAFLAM) 50 MG tablet; Take 1 tablet (50 mg total) by mouth 3 (three) times daily as needed.  Dispense: 30 tablet; Refill: 3 - CMP14+EGFR  8. Colon cancer screening  - Cologuard   Labs pending Patient reviewed in [place] controlled database, no flags noted. Contract and drug screen are up to date.  Continue current medications  Health Maintenance reviewed Diet and exercise encouraged  Follow up plan: 6 months   Jannifer Rodney, FNP

## 2023-03-26 LAB — CMP14+EGFR
ALT: 9 IU/L (ref 0–32)
AST: 18 IU/L (ref 0–40)
Albumin: 4.4 g/dL (ref 3.9–4.9)
Alkaline Phosphatase: 119 IU/L (ref 44–121)
BUN/Creatinine Ratio: 11 — ABNORMAL LOW (ref 12–28)
BUN: 8 mg/dL (ref 8–27)
Bilirubin Total: <0.2 mg/dL (ref 0.0–1.2)
CO2: 23 mmol/L (ref 20–29)
Calcium: 9.5 mg/dL (ref 8.7–10.3)
Chloride: 103 mmol/L (ref 96–106)
Creatinine, Ser: 0.73 mg/dL (ref 0.57–1.00)
Globulin, Total: 2.3 g/dL (ref 1.5–4.5)
Glucose: 79 mg/dL (ref 70–99)
Potassium: 4 mmol/L (ref 3.5–5.2)
Sodium: 146 mmol/L — ABNORMAL HIGH (ref 134–144)
Total Protein: 6.7 g/dL (ref 6.0–8.5)
eGFR: 92 mL/min/{1.73_m2} (ref >59)

## 2023-03-29 LAB — TOXASSURE SELECT 13 (MW), URINE

## 2023-04-12 DIAGNOSIS — Z1211 Encounter for screening for malignant neoplasm of colon: Secondary | ICD-10-CM | POA: Diagnosis not present

## 2023-04-17 LAB — COLOGUARD: COLOGUARD: NEGATIVE

## 2023-04-18 ENCOUNTER — Inpatient Hospital Stay: Admission: RE | Admit: 2023-04-18 | Payer: No Typology Code available for payment source | Source: Ambulatory Visit

## 2023-04-21 ENCOUNTER — Other Ambulatory Visit: Payer: Self-pay | Admitting: Family

## 2023-04-21 DIAGNOSIS — G43009 Migraine without aura, not intractable, without status migrainosus: Secondary | ICD-10-CM

## 2023-05-04 ENCOUNTER — Ambulatory Visit
Admission: RE | Admit: 2023-05-04 | Discharge: 2023-05-04 | Disposition: A | Discharge status: Home / Self Care | Payer: 59 | Source: Ambulatory Visit | Attending: Family | Admitting: Family

## 2023-05-04 DIAGNOSIS — Z1231 Encounter for screening mammogram for malignant neoplasm of breast: Secondary | ICD-10-CM | POA: Diagnosis not present

## 2023-06-23 ENCOUNTER — Other Ambulatory Visit: Payer: Self-pay | Admitting: Family

## 2023-06-23 DIAGNOSIS — G43009 Migraine without aura, not intractable, without status migrainosus: Secondary | ICD-10-CM

## 2023-06-30 ENCOUNTER — Encounter: Payer: Self-pay | Admitting: Internal Medicine

## 2023-07-22 ENCOUNTER — Other Ambulatory Visit: Payer: Self-pay | Admitting: Family

## 2023-07-22 DIAGNOSIS — G43009 Migraine without aura, not intractable, without status migrainosus: Secondary | ICD-10-CM

## 2023-08-18 ENCOUNTER — Other Ambulatory Visit: Payer: Self-pay | Admitting: Family

## 2023-08-18 DIAGNOSIS — Z79899 Other long term (current) drug therapy: Secondary | ICD-10-CM

## 2023-08-18 DIAGNOSIS — F1320 Sedative, hypnotic or anxiolytic dependence, uncomplicated: Secondary | ICD-10-CM

## 2023-08-18 DIAGNOSIS — F411 Generalized anxiety disorder: Secondary | ICD-10-CM

## 2023-09-20 ENCOUNTER — Other Ambulatory Visit: Payer: Self-pay | Admitting: Family

## 2023-09-20 DIAGNOSIS — Z79899 Other long term (current) drug therapy: Secondary | ICD-10-CM

## 2023-09-20 DIAGNOSIS — F1320 Sedative, hypnotic or anxiolytic dependence, uncomplicated: Secondary | ICD-10-CM

## 2023-09-20 DIAGNOSIS — F411 Generalized anxiety disorder: Secondary | ICD-10-CM

## 2023-09-26 ENCOUNTER — Telehealth: Payer: 59 | Admitting: Family

## 2023-09-26 ENCOUNTER — Encounter: Payer: Self-pay | Admitting: Family

## 2023-09-26 DIAGNOSIS — E782 Mixed hyperlipidemia: Secondary | ICD-10-CM

## 2023-09-26 DIAGNOSIS — G43009 Migraine without aura, not intractable, without status migrainosus: Secondary | ICD-10-CM

## 2023-09-26 DIAGNOSIS — R6889 Other general symptoms and signs: Secondary | ICD-10-CM

## 2023-09-26 DIAGNOSIS — Z79899 Other long term (current) drug therapy: Secondary | ICD-10-CM | POA: Diagnosis not present

## 2023-09-26 DIAGNOSIS — F1320 Sedative, hypnotic or anxiolytic dependence, uncomplicated: Secondary | ICD-10-CM | POA: Diagnosis not present

## 2023-09-26 DIAGNOSIS — F411 Generalized anxiety disorder: Secondary | ICD-10-CM

## 2023-09-26 MED ORDER — BENZONATATE 200 MG PO CAPS
200.0000 mg | ORAL_CAPSULE | Freq: Three times a day (TID) | ORAL | 1 refills | Status: AC | PRN
Start: 2023-09-26 — End: ?

## 2023-09-26 MED ORDER — DIAZEPAM 5 MG PO TABS
5.0000 mg | ORAL_TABLET | Freq: Two times a day (BID) | ORAL | 5 refills | Status: DC | PRN
Start: 2023-09-26 — End: 2023-12-26

## 2023-09-26 MED ORDER — FLUTICASONE PROPIONATE 50 MCG/ACT NA SUSP: 2.0000 | Freq: Every day | NASAL | 6 refills | Status: AC

## 2023-09-26 NOTE — Progress Notes (Signed)
 Virtual Visit Consent   Theresa Pham, you are scheduled for a virtual visit with a [facility] provider today. Just as with appointments in the office, your consent must be obtained to participate. Your consent will be active for this visit and any virtual visit you may have with one of our providers in the next 365 days. If you have a MyChart account, a copy of this consent can be sent to you electronically.  As this is a virtual visit, video technology does not allow for your provider to perform a traditional examination. This may limit your provider's ability to fully assess your condition. If your provider identifies any concerns that need to be evaluated in person or the need to arrange testing (such as labs, EKG, etc.), we will make arrangements to do so. Although advances in technology are sophisticated, we cannot ensure that it will always work on either your end or our end. If the connection with a video visit is poor, the visit may have to be switched to a telephone visit. With either a video or telephone visit, we are not always able to ensure that we have a secure connection.  By engaging in this virtual visit, you consent to the provision of healthcare and authorize for your insurance to be billed (if applicable) for the services provided during this visit. Depending on your insurance coverage, you may receive a charge related to this service.  I need to obtain your verbal consent now. Are you willing to proceed with your visit today? [name] has provided verbal consent on 09/26/2023 for a virtual visit (video or telephone). Jannifer Rodney, FNP  Date: 09/26/2023 3:31 PM   Virtual Visit via Video Note   I, Jannifer Rodney, connected with  Theresa Pham  ([id number], [birth year]) on 09/26/23 at  3:40 PM EST by a video-enabled telemedicine application and verified that I am speaking with the correct person using two identifiers.  Location: Patient: Virtual Visit Location  Patient: Home Provider: Virtual Visit Location Provider: Home Office   I discussed the limitations of evaluation and management by telemedicine and the availability of in person appointments. The patient expressed understanding and agreed to proceed.    History of Present Illness: Theresa Pham is a 63 y.o. who identifies as a female who was assigned female at birth, and is being seen today for chronic follow up.  She is requesting her Valium refilled.    Her mother passed away 03-Sep-2023.   HPI: Migraine  This is a chronic problem. The current episode started more than 1 year ago. Episode frequency: once a monthy. The problem has been unchanged. The pain is located in the Left unilateral region. Associated symptoms include coughing, nausea, phonophobia and photophobia. Pertinent negatives include no ear pain, fever, sore throat or weight loss. She has tried beta blockers for the symptoms. The treatment provided mild relief.  Hyperlipidemia This is a chronic problem. The current episode started more than 1 year ago. The problem is uncontrolled. Recent lipid tests were reviewed and are high. Current antihyperlipidemic treatment includes diet change. The current treatment provides mild improvement of lipids. Risk factors for coronary artery disease include dyslipidemia, a sedentary lifestyle and post-menopausal.  Anxiety Presents for follow-up visit. Symptoms include excessive worry, nausea, nervous/anxious behavior and restlessness. Symptoms occur most days. The severity of symptoms is moderate.    Cough This is a new problem. The current episode started in the past 7 days. The problem has been gradually  worsening. The problem occurs every few minutes. The cough is Non-productive. Associated symptoms include headaches and nasal congestion. Pertinent negatives include no ear congestion, ear pain, fever, sore throat or weight loss.    Problems:  Patient Active Problem List   Diagnosis Date  Noted   Benzodiazepine dependence (HCC) 07/09/2019   Controlled substance agreement signed 07/09/2019   Hypokalemia 12/01/2015   Allergic rhinitis 12/01/2015   Vitamin D deficiency 02/20/2014   Generalized anxiety disorder 12/07/2012   Migraine 12/07/2012   Hyperlipidemia 12/07/2012    Allergies:  Allergies  Allergen Reactions   Ampicillin Rash   Azithromycin Diarrhea   Penicillins Rash   Erythromycin    Other     CORNSTARCH -MIGRAINES   Codeine Nausea Only   Medications:  Current Outpatient Medications:    Aspirin-Acetaminophen-Caffeine (GOODY HEADACHE PO), Take 1 packet by mouth every 6 (six) hours as needed (HEADACHES)., Disp: , Rfl:    benzonatate (TESSALON) 200 MG capsule, Take 1 capsule (200 mg total) by mouth 3 (three) times daily as needed., Disp: 30 capsule, Rfl: 1   cetirizine (ZYRTEC) 10 MG tablet, TAKE ONE (1) TABLET BY MOUTH EVERY DAY, Disp: 90 tablet, Rfl: 1   Cholecalciferol (VITAMIN D) 50 MCG (2000 UT) CAPS, , Disp: , Rfl:    diazepam (VALIUM) 5 MG tablet, Take 1 tablet (5 mg total) by mouth every 12 (twelve) hours as needed for anxiety. TAKE 1 TABLET EVERY 8 HOURS AS NEEDED FOR ANXIETY, Disp: 60 tablet, Rfl: 5   diclofenac (CATAFLAM) 50 MG tablet, Take 1 tablet (50 mg total) by mouth 3 (three) times daily as needed., Disp: 30 tablet, Rfl: 3   diphenhydrAMINE (BENADRYL) 25 MG tablet, Take 25 mg by mouth every 6 (six) hours as needed for allergies or sleep., Disp: , Rfl:    fluticasone (FLONASE) 50 MCG/ACT nasal spray, Place 2 sprays into both nostrils daily., Disp: 16 g, Rfl: 6   hydrOXYzine (VISTARIL) 25 MG capsule, Take 1 capsule (25 mg total) by mouth 3 (three) times daily as needed. (Patient not taking: Reported on 09/23/2022), Disp: 90 capsule, Rfl: 0   magnesium 30 MG tablet, Take 30 mg by mouth 2 (two) times daily., Disp: , Rfl:    propranolol (INDERAL) 40 MG tablet, TAKE ONE (1) TABLET BY MOUTH TWO (2) TIMES DAILY, Disp: 180 tablet, Rfl: 1   SUMAtriptan  (IMITREX) 100 MG tablet, TAKE 1 TABLET AT ONSET OF MIGRAINE HEADACHE MAY REPEAT ONCE IN 2 HOURS (MAX OF 2 TABLETS/24 HOURS), Disp: 18 tablet, Rfl: 1   venlafaxine XR (EFFEXOR-XR) 150 MG 24 hr capsule, Take 1 capsule (150 mg total) by mouth daily with breakfast., Disp: 90 capsule, Rfl: 3   vitamin B-12 (CYANOCOBALAMIN) 1000 MCG tablet, Take 1,000 mcg by mouth daily., Disp: , Rfl:    Vitamin D, Ergocalciferol, (DRISDOL) 1.25 MG (50000 UNIT) CAPS capsule, Take 1 capsule (50,000 Units total) by mouth every 7 (seven) days., Disp: 12 capsule, Rfl: 3  Observations/Objective: Patient is well-developed, well-nourished in no acute distress.  Resting comfortably  at home.  Head is normocephalic, atraumatic.  No labored breathing.  Speech is clear and coherent with logical content.  Patient is alert and oriented at baseline.  Dry nonproductive cough  Assessment and Plan: 1. Migraine without aura and without status migrainosus, not intractable (Primary)  2. Mixed hyperlipidemia  3. Generalized anxiety disorder - diazepam (VALIUM) 5 MG tablet; Take 1 tablet (5 mg total) by mouth every 12 (twelve) hours as needed for anxiety. TAKE  1 TABLET EVERY 8 HOURS AS NEEDED FOR ANXIETY  Dispense: 60 tablet; Refill: 5  4. Controlled substance agreement signed - diazepam (VALIUM) 5 MG tablet; Take 1 tablet (5 mg total) by mouth every 12 (twelve) hours as needed for anxiety. TAKE 1 TABLET EVERY 8 HOURS AS NEEDED FOR ANXIETY  Dispense: 60 tablet; Refill: 5  5. Benzodiazepine dependence (HCC) - diazepam (VALIUM) 5 MG tablet; Take 1 tablet (5 mg total) by mouth every 12 (twelve) hours as needed for anxiety. TAKE 1 TABLET EVERY 8 HOURS AS NEEDED FOR ANXIETY  Dispense: 60 tablet; Refill: 5  6. Flu-like symptoms - benzonatate (TESSALON) 200 MG capsule; Take 1 capsule (200 mg total) by mouth 3 (three) times daily as needed.  Dispense: 30 capsule; Refill: 1 - fluticasone (FLONASE) 50 MCG/ACT nasal spray; Place 2 sprays  into both nostrils daily.  Dispense: 16 g; Refill: 6  Continue current medications  Patient reviewed in [place] controlled database, no flags noted.  - Take meds as prescribed - Use a cool mist humidifier  -Use saline nose sprays frequently -Force fluids -For any cough or congestion  Use plain Mucinex- regular strength or max strength is fine -For fever or aces or pains- take tylenol or ibuprofen. -Throat lozenges if help Follow up if symptoms worsen or do not improve. Follow up in 3 months    Follow Up Instructions: I discussed the assessment and treatment plan with the patient. The patient was provided an opportunity to ask questions and all were answered. The patient agreed with the plan and demonstrated an understanding of the instructions.  A copy of instructions were sent to the patient via MyChart unless otherwise noted below.     The patient was advised to call back or seek an in-person evaluation if the symptoms worsen or if the condition fails to improve as anticipated.    Jannifer Rodney, FNP

## 2023-10-18 ENCOUNTER — Other Ambulatory Visit: Payer: Self-pay | Admitting: Family

## 2023-11-16 ENCOUNTER — Other Ambulatory Visit: Payer: Self-pay | Admitting: Family

## 2023-11-16 DIAGNOSIS — G43009 Migraine without aura, not intractable, without status migrainosus: Secondary | ICD-10-CM

## 2023-12-14 ENCOUNTER — Other Ambulatory Visit: Payer: Self-pay | Admitting: Family

## 2023-12-14 DIAGNOSIS — G43009 Migraine without aura, not intractable, without status migrainosus: Secondary | ICD-10-CM

## 2023-12-19 ENCOUNTER — Encounter (HOSPITAL_COMMUNITY): Payer: Self-pay

## 2023-12-26 ENCOUNTER — Encounter: Payer: Self-pay | Admitting: Family

## 2023-12-26 ENCOUNTER — Ambulatory Visit: Payer: 59 | Admitting: Family

## 2023-12-26 VITALS — BP 129/79 | HR 77 | Temp 97.4°F | Ht 67.0 in | Wt 151.2 lb

## 2023-12-26 DIAGNOSIS — E559 Vitamin D deficiency, unspecified: Secondary | ICD-10-CM | POA: Diagnosis not present

## 2023-12-26 DIAGNOSIS — G43009 Migraine without aura, not intractable, without status migrainosus: Secondary | ICD-10-CM

## 2023-12-26 DIAGNOSIS — E782 Mixed hyperlipidemia: Secondary | ICD-10-CM | POA: Diagnosis not present

## 2023-12-26 DIAGNOSIS — F1320 Sedative, hypnotic or anxiolytic dependence, uncomplicated: Secondary | ICD-10-CM | POA: Diagnosis not present

## 2023-12-26 DIAGNOSIS — Z79899 Other long term (current) drug therapy: Secondary | ICD-10-CM

## 2023-12-26 DIAGNOSIS — Z0001 Encounter for general adult medical examination with abnormal findings: Secondary | ICD-10-CM

## 2023-12-26 DIAGNOSIS — Z0000 Encounter for general adult medical examination without abnormal findings: Secondary | ICD-10-CM | POA: Diagnosis not present

## 2023-12-26 DIAGNOSIS — F411 Generalized anxiety disorder: Secondary | ICD-10-CM

## 2023-12-26 MED ORDER — DIAZEPAM 5 MG PO TABS: 5.0000 mg | ORAL_TABLET | Freq: Two times a day (BID) | ORAL | 5 refills | Status: DC | PRN

## 2023-12-26 NOTE — Patient Instructions (Signed)

## 2023-12-26 NOTE — Progress Notes (Signed)
 Subjective:    Patient ID: Theresa Pham, female    DOB: 22-Jun-1961, 63 y.o.   MRN: [id number]  Chief Complaint  Patient presents with   Medical Management of Chronic Issues   Pt presents to the office today for CPE and chronic follow up.    She is requesting her Valium  refilled. She is followed by Neurologists as needed for migraines. She was started on propranolol  40 mg BID that has greatly helped with her headaches.    She retired in March 2023. Enjoying this. Her mother passed away since our last visit.  Back Pain This is a chronic problem. The current episode started more than 1 year ago. The problem occurs intermittently. The problem has been waxing and waning since onset. The pain is present in the lumbar spine. The quality of the pain is described as aching. The pain is at a severity of 3/10. The pain is moderate. She has tried NSAIDs for the symptoms. The treatment provided moderate relief.  Anxiety Presents for follow-up visit. Symptoms include depressed mood, excessive worry, nausea and nervous/anxious behavior. Symptoms occur rarely. The severity of symptoms is moderate.    Hyperlipidemia This is a chronic problem. The current episode started more than 1 year ago. The problem is uncontrolled. Recent lipid tests were reviewed and are high. Current antihyperlipidemic treatment includes diet change. The current treatment provides mild improvement of lipids. Risk factors for coronary artery disease include dyslipidemia, hypertension, a sedentary lifestyle and post-menopausal.  Migraine  This is a chronic problem. The current episode started more than 1 year ago. The problem occurs intermittently. Progression since onset: 2-3 a month. The pain is located in the Left unilateral region. The pain quality is similar to prior headaches. Associated symptoms include back pain, nausea, phonophobia and photophobia. Pertinent negatives include no vomiting. She has tried beta blockers for the  symptoms. The treatment provided moderate relief. Her past medical history is significant for migraine headaches.      Review of Systems  Eyes:  Positive for photophobia.  Gastrointestinal:  Positive for nausea. Negative for vomiting.  Musculoskeletal:  Positive for back pain.  Psychiatric/Behavioral:  The patient is nervous/anxious.   All other systems reviewed and are negative.  Family History  Problem Relation Age of Onset   Hypertension Mother    Uterine cancer Mother    Cancer Mother        uterine   Migraines Mother    Heart attack Father 61   Migraines Daughter    Breast cancer Maternal Aunt    Cancer Maternal Aunt        breast   Colon cancer Neg Hx    Social History   Socioeconomic History   Marital status: Married    Spouse name: Darrow End   Number of children: 1   Years of education: college3   Highest education level: Associate degree: academic program  Occupational History   Occupation: town Restaurant manager, fast food.   Occupation: TOWN Network engineer: [employer]    Comment: full time  Tobacco Use   Smoking status: Former    Current packs/day: 0.00    Types: Cigarettes    Start date: 08/10/1971    Quit date: 08/09/1978    Years since quitting: 45.4   Smokeless tobacco: Never  Vaping Use   Vaping status: Never Used  Substance and Sexual Activity   Alcohol use: No    Alcohol/week: 0.0 standard drinks of alcohol    Comment:  rare   Drug use: No   Sexual activity: Yes  Other Topics Concern   Not on file  Social History Narrative   Patient lives at home husband Darrow End.   Patient is right handed.    Patient drinks about 24 ounces of mountain dew daily.   Social Drivers of Corporate investment banker Strain: Low Risk  (09/25/2023)   Overall Financial Resource Strain (CARDIA)    Difficulty of Paying Living Expenses: Not hard at all  Food Insecurity: No Food Insecurity (09/25/2023)   Hunger Vital Sign    Worried About Running Out of Food in the Last  Year: Never true    Ran Out of Food in the Last Year: Never true  Transportation Needs: No Transportation Needs (09/25/2023)   PRAPARE - Administrator, Civil Service (Medical): No    Lack of Transportation (Non-Medical): No  Physical Activity: Insufficiently Active (09/25/2023)   Exercise Vital Sign    Days of Exercise per Week: 4 days    Minutes of Exercise per Session: 10 min  Stress: No Stress Concern Present (09/25/2023)   [employer] of Occupational Health - Occupational Stress Questionnaire    Feeling of Stress : Not at all  Social Connections: Moderately Integrated (09/25/2023)   Social Connection and Isolation Panel [NHANES]    Frequency of Communication with Friends and Family: More than three times a week    Frequency of Social Gatherings with Friends and Family: Twice a week    Attends Religious Services: More than 4 times per year    Active Member of [employer] or Organizations: No    Attends Engineer, structural: Not on file    Marital Status: Married       Objective:   Physical Exam Vitals reviewed.  Constitutional:      General: She is not in acute distress.    Appearance: She is well-developed.  HENT:     Head: Normocephalic and atraumatic.     Right Ear: Tympanic membrane normal.     Left Ear: Tympanic membrane normal.  Eyes:     Pupils: Pupils are equal, round, and reactive to light.  Neck:     Thyroid : No thyromegaly.  Cardiovascular:     Rate and Rhythm: Normal rate and regular rhythm.     Heart sounds: Normal heart sounds. No murmur heard. Pulmonary:     Effort: Pulmonary effort is normal. No respiratory distress.     Breath sounds: Normal breath sounds. No wheezing.  Abdominal:     General: Bowel sounds are normal. There is no distension.     Palpations: Abdomen is soft.     Tenderness: There is no abdominal tenderness.  Musculoskeletal:        General: No tenderness. Normal range of motion.     Cervical back: Normal range of  motion and neck supple.  Skin:    General: Skin is warm and dry.  Neurological:     Mental Status: She is alert and oriented to person, place, and time.     Cranial Nerves: No cranial nerve deficit.     Deep Tendon Reflexes: Reflexes are normal and symmetric.  Psychiatric:        Behavior: Behavior normal.        Thought Content: Thought content normal.        Judgment: Judgment normal.     BP 129/79   Pulse 77   Temp (!) 97.4 F (36.3 C) (Temporal)  Ht 5\' 7"  (1.702 m)   Wt 151 lb 3.2 oz (68.6 kg)   BMI 23.68 kg/m        Assessment & Plan:   FATIME BISWELL comes in today with chief complaint of Medical Management of Chronic Issues   Diagnosis and orders addressed:  1. Annual physical exam (Primary) - CMP14+EGFR - CBC with Differential/Platelet - Lipid panel  2. Generalized anxiety disorder - diazepam  (VALIUM ) 5 MG tablet; Take 1 tablet (5 mg total) by mouth every 12 (twelve) hours as needed for anxiety. TAKE 1 TABLET EVERY 8 HOURS AS NEEDED FOR ANXIETY  Dispense: 60 tablet; Refill: 5 - CMP14+EGFR - CBC with Differential/Platelet  3. Controlled substance agreement signed - diazepam  (VALIUM ) 5 MG tablet; Take 1 tablet (5 mg total) by mouth every 12 (twelve) hours as needed for anxiety. TAKE 1 TABLET EVERY 8 HOURS AS NEEDED FOR ANXIETY  Dispense: 60 tablet; Refill: 5 - CMP14+EGFR - CBC with Differential/Platelet  4. Benzodiazepine dependence (HCC)  - diazepam  (VALIUM ) 5 MG tablet; Take 1 tablet (5 mg total) by mouth every 12 (twelve) hours as needed for anxiety. TAKE 1 TABLET EVERY 8 HOURS AS NEEDED FOR ANXIETY  Dispense: 60 tablet; Refill: 5 - CMP14+EGFR - CBC with Differential/Platelet  5. Vitamin D  deficiency - CMP14+EGFR - CBC with Differential/Platelet  6. Mixed hyperlipidemia - CMP14+EGFR - CBC with Differential/Platelet  7. Migraine without aura and without status migrainosus, not intractable - CMP14+EGFR - CBC with  Differential/Platelet   Labs pending Patient reviewed in [place] controlled database, no flags noted. Contract and drug screen are up to date.  Continue current medications  Health Maintenance reviewed Diet and exercise encouraged  Follow up plan: 6 months   Tommas Fragmin, FNP

## 2023-12-27 ENCOUNTER — Ambulatory Visit: Payer: Self-pay | Admitting: Family

## 2023-12-27 LAB — CMP14+EGFR
ALT: 13 IU/L (ref 0–32)
AST: 19 IU/L (ref 0–40)
Albumin: 4.4 g/dL (ref 3.9–4.9)
Alkaline Phosphatase: 105 IU/L (ref 44–121)
BUN/Creatinine Ratio: 14 (ref 12–28)
BUN: 12 mg/dL (ref 8–27)
Bilirubin Total: <0.2 mg/dL (ref 0.0–1.2)
CO2: 18 mmol/L — ABNORMAL LOW (ref 20–29)
Calcium: 9.5 mg/dL (ref 8.7–10.3)
Chloride: 105 mmol/L (ref 96–106)
Creatinine, Ser: 0.87 mg/dL (ref 0.57–1.00)
Globulin, Total: 2.1 g/dL (ref 1.5–4.5)
Glucose: 94 mg/dL (ref 70–99)
Potassium: 3.9 mmol/L (ref 3.5–5.2)
Sodium: 139 mmol/L (ref 134–144)
Total Protein: 6.5 g/dL (ref 6.0–8.5)
eGFR: 74 mL/min/{1.73_m2} (ref >59)

## 2023-12-27 LAB — CBC WITH DIFFERENTIAL/PLATELET
Basophils Absolute: 0.1 10*3/uL (ref 0.0–0.2)
Basos: 1 %
EOS (ABSOLUTE): 0.2 10*3/uL (ref 0.0–0.4)
Eos: 3 %
Hematocrit: 38.8 % (ref 34.0–46.6)
Hemoglobin: 13 g/dL (ref 11.1–15.9)
Immature Grans (Abs): 0 10*3/uL (ref 0.0–0.1)
Immature Granulocytes: 0 %
Lymphocytes Absolute: 3.2 10*3/uL — ABNORMAL HIGH (ref 0.7–3.1)
Lymphs: 35 %
MCH: 32.7 pg (ref 26.6–33.0)
MCHC: 33.5 g/dL (ref 31.5–35.7)
MCV: 98 fL — ABNORMAL HIGH (ref 79–97)
Monocytes Absolute: 0.6 10*3/uL (ref 0.1–0.9)
Monocytes: 7 %
Neutrophils Absolute: 5 10*3/uL (ref 1.4–7.0)
Neutrophils: 54 %
Platelets: 345 10*3/uL (ref 150–450)
RBC: 3.97 x10E6/uL (ref 3.77–5.28)
RDW: 13.7 % (ref 11.7–15.4)
WBC: 9.1 10*3/uL (ref 3.4–10.8)

## 2023-12-27 LAB — LIPID PANEL
Chol/HDL Ratio: 7.3 ratio — ABNORMAL HIGH (ref 0.0–4.4)
Cholesterol, Total: 276 mg/dL — ABNORMAL HIGH (ref 100–199)
HDL: 38 mg/dL — ABNORMAL LOW (ref >39)
LDL Chol Calc (NIH): 201 mg/dL — ABNORMAL HIGH (ref 0–99)
Triglycerides: 191 mg/dL — ABNORMAL HIGH (ref 0–149)
VLDL Cholesterol Cal: 37 mg/dL (ref 5–40)

## 2023-12-27 MED ORDER — ROSUVASTATIN CALCIUM 10 MG PO TABS
10.0000 mg | ORAL_TABLET | Freq: Every day | ORAL | 3 refills | Status: DC
Start: 2023-12-27 — End: 2024-06-29

## 2024-02-07 ENCOUNTER — Other Ambulatory Visit: Payer: Self-pay | Admitting: Family

## 2024-02-07 DIAGNOSIS — G43009 Migraine without aura, not intractable, without status migrainosus: Secondary | ICD-10-CM

## 2024-03-14 ENCOUNTER — Other Ambulatory Visit: Payer: Self-pay | Admitting: Family

## 2024-03-14 DIAGNOSIS — F411 Generalized anxiety disorder: Secondary | ICD-10-CM

## 2024-04-12 ENCOUNTER — Other Ambulatory Visit: Payer: Self-pay | Admitting: Family

## 2024-05-29 ENCOUNTER — Encounter: Payer: Self-pay | Admitting: *Deleted

## 2024-06-11 ENCOUNTER — Other Ambulatory Visit: Payer: Self-pay | Admitting: Family

## 2024-06-11 DIAGNOSIS — G43009 Migraine without aura, not intractable, without status migrainosus: Secondary | ICD-10-CM

## 2024-06-11 DIAGNOSIS — F411 Generalized anxiety disorder: Secondary | ICD-10-CM

## 2024-06-28 ENCOUNTER — Ambulatory Visit: Admitting: Family

## 2024-06-29 ENCOUNTER — Other Ambulatory Visit: Payer: Self-pay | Admitting: Medical Genetics

## 2024-06-29 ENCOUNTER — Ambulatory Visit: Payer: Self-pay | Admitting: Family

## 2024-06-29 ENCOUNTER — Encounter: Payer: Self-pay | Admitting: Family

## 2024-06-29 VITALS — BP 112/78 | HR 87 | Temp 97.4°F | Ht 67.0 in | Wt 144.0 lb

## 2024-06-29 DIAGNOSIS — G43009 Migraine without aura, not intractable, without status migrainosus: Secondary | ICD-10-CM

## 2024-06-29 DIAGNOSIS — F411 Generalized anxiety disorder: Secondary | ICD-10-CM | POA: Diagnosis not present

## 2024-06-29 DIAGNOSIS — F1320 Sedative, hypnotic or anxiolytic dependence, uncomplicated: Secondary | ICD-10-CM

## 2024-06-29 DIAGNOSIS — Z1159 Encounter for screening for other viral diseases: Secondary | ICD-10-CM | POA: Diagnosis not present

## 2024-06-29 DIAGNOSIS — Z79899 Other long term (current) drug therapy: Secondary | ICD-10-CM

## 2024-06-29 DIAGNOSIS — E782 Mixed hyperlipidemia: Secondary | ICD-10-CM

## 2024-06-29 DIAGNOSIS — E559 Vitamin D deficiency, unspecified: Secondary | ICD-10-CM | POA: Diagnosis not present

## 2024-06-29 MED ORDER — SUMATRIPTAN SUCCINATE 100 MG PO TABS: ORAL_TABLET | ORAL | 2 refills | Status: AC

## 2024-06-29 MED ORDER — DIAZEPAM 5 MG PO TABS: 5.0000 mg | ORAL_TABLET | Freq: Two times a day (BID) | ORAL | 5 refills | Status: AC | PRN

## 2024-06-29 NOTE — Patient Instructions (Signed)

## 2024-06-29 NOTE — Progress Notes (Signed)
 Subjective:    Patient ID: Theresa Pham, female    DOB: 1961/01/03, 63 y.o.   MRN: [id number]  Chief Complaint  Patient presents with   Medical Management of Chronic Issues   Pt presents to the office today for chronic follow up.    She is requesting her Valium  refilled.   She is followed by Neurologists as needed for migraines. She was started on propranolol  40 mg BID that has greatly helped with her headaches.    She retired in March 2023. Enjoying this. Her mother passed away since our last visit.  Back Pain This is a chronic problem. The current episode started more than 1 year ago. The problem occurs intermittently. The problem has been waxing and waning since onset. The pain is present in the lumbar spine. The quality of the pain is described as aching. The pain is at a severity of 0/10 (can be 5-6). The pain is moderate. She has tried NSAIDs for the symptoms. The treatment provided moderate relief.  Anxiety Presents for follow-up visit. Symptoms include depressed mood, excessive worry, nausea and nervous/anxious behavior. Symptoms occur most days. The severity of symptoms is moderate.    Hyperlipidemia This is a chronic problem. The current episode started more than 1 year ago. The problem is uncontrolled. Recent lipid tests were reviewed and are high. Current antihyperlipidemic treatment includes diet change. The current treatment provides mild improvement of lipids. Risk factors for coronary artery disease include dyslipidemia, hypertension, a sedentary lifestyle and post-menopausal.  Migraine  This is a chronic problem. The current episode started more than 1 year ago. The problem occurs intermittently. Progression since onset: 2 a month. The pain is located in the Left unilateral region. The pain quality is similar to prior headaches. Associated symptoms include back pain, nausea, phonophobia and photophobia. Pertinent negatives include no vomiting. She has tried beta  blockers for the symptoms. The treatment provided moderate relief. Her past medical history is significant for migraine headaches.      Review of Systems  Eyes:  Positive for photophobia.  Gastrointestinal:  Positive for nausea. Negative for vomiting.  Musculoskeletal:  Positive for back pain.  Psychiatric/Behavioral:  The patient is nervous/anxious.   All other systems reviewed and are negative.  Family History  Problem Relation Age of Onset   Hypertension Mother    Uterine cancer Mother    Cancer Mother        uterine   Migraines Mother    Heart attack Father 59   Migraines Daughter    Breast cancer Maternal Aunt    Cancer Maternal Aunt        breast   Colon cancer Neg Hx    Social History   Socioeconomic History   Marital status: Married    Spouse name: Theresa Pham   Number of children: 1   Years of education: college3   Highest education level: Associate degree: academic program  Occupational History   Occupation: town restaurant manager, fast food.   Occupation: TOWN Network Engineer: [employer]    Comment: full time  Tobacco Use   Smoking status: Former    Current packs/day: 0.00    Types: Cigarettes    Start date: 08/10/1971    Quit date: 08/09/1978    Years since quitting: 45.9   Smokeless tobacco: Never  Vaping Use   Vaping status: Never Used  Substance and Sexual Activity   Alcohol use: No    Alcohol/week: 0.0 standard drinks of alcohol  Comment: rare   Drug use: No   Sexual activity: Yes  Other Topics Concern   Not on file  Social History Narrative   Patient lives at home husband Theresa Pham.   Patient is right handed.    Patient drinks about 24 ounces of mountain dew daily.   Social Drivers of Corporate Investment Banker Strain: Low Risk  (06/22/2024)   Overall Financial Resource Strain (CARDIA)    Difficulty of Paying Living Expenses: Not very hard  Food Insecurity: No Food Insecurity (06/22/2024)   Hunger Vital Sign    Worried About Running Out of  Food in the Last Year: Never true    Ran Out of Food in the Last Year: Never true  Transportation Needs: No Transportation Needs (06/22/2024)   PRAPARE - Administrator, Civil Service (Medical): No    Lack of Transportation (Non-Medical): No  Physical Activity: Sufficiently Active (06/22/2024)   Exercise Vital Sign    Days of Exercise per Week: 5 days    Minutes of Exercise per Session: 30 min  Stress: No Stress Concern Present (06/22/2024)   [employer] of Occupational Health - Occupational Stress Questionnaire    Feeling of Stress: Only a little  Social Connections: Moderately Isolated (06/22/2024)   Social Connection and Isolation Panel    Frequency of Communication with Friends and Family: More than three times a week    Frequency of Social Gatherings with Friends and Family: More than three times a week    Attends Religious Services: Never    Database Administrator or Organizations: No    Attends Engineer, Structural: Not on file    Marital Status: Married       Objective:   Physical Exam Vitals reviewed.  Constitutional:      General: She is not in acute distress.    Appearance: She is well-developed.  HENT:     Head: Normocephalic and atraumatic.     Right Ear: Tympanic membrane normal.     Left Ear: Tympanic membrane normal.  Eyes:     Pupils: Pupils are equal, round, and reactive to light.  Neck:     Thyroid : No thyromegaly.  Cardiovascular:     Rate and Rhythm: Normal rate and regular rhythm.     Heart sounds: Normal heart sounds. No murmur heard. Pulmonary:     Effort: Pulmonary effort is normal. No respiratory distress.     Breath sounds: Normal breath sounds. No wheezing.  Abdominal:     General: Bowel sounds are normal. There is no distension.     Palpations: Abdomen is soft.     Tenderness: There is no abdominal tenderness.  Musculoskeletal:        General: No tenderness. Normal range of motion.     Cervical back: Normal range  of motion and neck supple.  Skin:    General: Skin is warm and dry.  Neurological:     Mental Status: She is alert and oriented to person, place, and time.     Cranial Nerves: No cranial nerve deficit.     Deep Tendon Reflexes: Reflexes are normal and symmetric.  Psychiatric:        Behavior: Behavior normal.        Thought Content: Thought content normal.        Judgment: Judgment normal.     BP 112/78   Pulse 87   Temp (!) 97.4 F (36.3 C)   Ht 5' 7 (1.702 m)  Wt 144 lb (65.3 kg)   SpO2 95%   BMI 22.55 kg/m        Assessment & Plan:   DONALD JACQUE comes in today with chief complaint of Medical Management of Chronic Issues   Diagnosis and orders addressed:  1. Generalized anxiety disorder (Primary) - diazepam  (VALIUM ) 5 MG tablet; Take 1 tablet (5 mg total) by mouth every 12 (twelve) hours as needed for anxiety. TAKE 1 TABLET EVERY 8 HOURS AS NEEDED FOR ANXIETY  Dispense: 60 tablet; Refill: 5 - CMP14+EGFR  2. Controlled substance agreement signed - diazepam  (VALIUM ) 5 MG tablet; Take 1 tablet (5 mg total) by mouth every 12 (twelve) hours as needed for anxiety. TAKE 1 TABLET EVERY 8 HOURS AS NEEDED FOR ANXIETY  Dispense: 60 tablet; Refill: 5 - CMP14+EGFR  3. Benzodiazepine dependence (HCC) - diazepam  (VALIUM ) 5 MG tablet; Take 1 tablet (5 mg total) by mouth every 12 (twelve) hours as needed for anxiety. TAKE 1 TABLET EVERY 8 HOURS AS NEEDED FOR ANXIETY  Dispense: 60 tablet; Refill: 5 - CMP14+EGFR  4. Migraine without aura and without status migrainosus, not intractable  - SUMAtriptan  (IMITREX ) 100 MG tablet; TAKE 1 TABLET AT ONSET OF MIGRAINE HEADACHE MAY REPEAT ONCE IN 2 HOURS (MAX OF 2 TABLETS/24 HOURS)  Dispense: 18 tablet; Refill: 2 - CMP14+EGFR  5. Vitamin D  deficiency - CMP14+EGFR  6. Mixed hyperlipidemia  - CMP14+EGFR  7. Need for hepatitis B screening test - Hepatitis B surface antibody,quantitative    Labs pending Patient reviewed in [place]  controlled database, no flags noted. Contract and drug screen are up to date.  Continue current medications  Health Maintenance reviewed Diet and exercise encouraged  Follow up plan: 6 months   Bari Learn, FNP

## 2024-06-29 NOTE — Addendum Note (Signed)
 Addended by: LAVELL LYE A on: 06/29/2024 11:48 AM   Modules accepted: Level of Service

## 2024-06-30 LAB — CMP14+EGFR
ALT: 9 IU/L (ref 0–32)
AST: 15 IU/L (ref 0–40)
Albumin: 4.5 g/dL (ref 3.9–4.9)
Alkaline Phosphatase: 96 IU/L (ref 49–135)
BUN/Creatinine Ratio: 13 (ref 12–28)
BUN: 11 mg/dL (ref 8–27)
Bilirubin Total: <0.2 mg/dL (ref 0.0–1.2)
CO2: 23 mmol/L (ref 20–29)
Calcium: 9.7 mg/dL (ref 8.7–10.3)
Chloride: 105 mmol/L (ref 96–106)
Creatinine, Ser: 0.83 mg/dL (ref 0.57–1.00)
Globulin, Total: 2.4 g/dL (ref 1.5–4.5)
Glucose: 84 mg/dL (ref 70–99)
Potassium: 4.1 mmol/L (ref 3.5–5.2)
Sodium: 142 mmol/L (ref 134–144)
Total Protein: 6.9 g/dL (ref 6.0–8.5)
eGFR: 79 mL/min/1.73 (ref >59)

## 2024-06-30 LAB — HEPATITIS B SURFACE ANTIBODY, QUANTITATIVE: Hepatitis B Surf Ab Quant: <3.5 m[IU]/mL — ABNORMAL LOW

## 2024-07-02 ENCOUNTER — Ambulatory Visit: Payer: Self-pay | Admitting: Family

## 2024-09-06 ENCOUNTER — Other Ambulatory Visit: Payer: Self-pay | Admitting: Family

## 2024-09-06 DIAGNOSIS — F411 Generalized anxiety disorder: Secondary | ICD-10-CM

## 2024-12-27 ENCOUNTER — Ambulatory Visit: Admitting: Family
